# Patient Record
Sex: Male | Born: 1955 | Race: Black or African American | Hispanic: No | Marital: Married | Smoking: Never smoker
Health system: Southern US, Community
[De-identification: ages and names within clinical notes are randomized; demographics above are authoritative.]

## PROBLEM LIST (undated history)

## (undated) DIAGNOSIS — I2510 Atherosclerotic heart disease of native coronary artery without angina pectoris: Secondary | ICD-10-CM

## (undated) DIAGNOSIS — I10 Essential (primary) hypertension: Secondary | ICD-10-CM

## (undated) DIAGNOSIS — I639 Cerebral infarction, unspecified: Secondary | ICD-10-CM

## (undated) DIAGNOSIS — C801 Malignant (primary) neoplasm, unspecified: Secondary | ICD-10-CM

## (undated) DIAGNOSIS — R51 Headache: Secondary | ICD-10-CM

## (undated) DIAGNOSIS — M1990 Unspecified osteoarthritis, unspecified site: Secondary | ICD-10-CM

## (undated) DIAGNOSIS — F329 Major depressive disorder, single episode, unspecified: Secondary | ICD-10-CM

## (undated) DIAGNOSIS — I219 Acute myocardial infarction, unspecified: Secondary | ICD-10-CM

## (undated) DIAGNOSIS — Z8719 Personal history of other diseases of the digestive system: Secondary | ICD-10-CM

## (undated) DIAGNOSIS — F32A Depression, unspecified: Secondary | ICD-10-CM

## (undated) DIAGNOSIS — G629 Polyneuropathy, unspecified: Secondary | ICD-10-CM

## (undated) DIAGNOSIS — F419 Anxiety disorder, unspecified: Secondary | ICD-10-CM

## (undated) HISTORY — PX: HERNIA REPAIR: SHX51

## (undated) HISTORY — PX: APPENDECTOMY: SHX54

## (undated) HISTORY — PX: CHOLECYSTECTOMY: SHX55

## (undated) HISTORY — PX: KNEE SURGERY: SHX244

## (undated) HISTORY — PX: BACK SURGERY: SHX140

---

## 2008-01-03 ENCOUNTER — Other Ambulatory Visit: Payer: Self-pay

## 2008-01-03 ENCOUNTER — Emergency Department: Payer: Self-pay | Admitting: Emergency Medicine

## 2008-01-16 ENCOUNTER — Emergency Department: Payer: Self-pay | Admitting: Internal Medicine

## 2008-01-16 ENCOUNTER — Other Ambulatory Visit: Payer: Self-pay

## 2008-04-24 ENCOUNTER — Inpatient Hospital Stay (HOSPITAL_COMMUNITY): Admission: RE | Admit: 2008-04-24 | Discharge: 2008-04-26 | Payer: Self-pay | Admitting: Urology

## 2008-04-24 ENCOUNTER — Encounter (INDEPENDENT_AMBULATORY_CARE_PROVIDER_SITE_OTHER): Payer: Self-pay | Admitting: Urology

## 2008-05-19 HISTORY — PX: PROSTATE SURGERY: SHX751

## 2008-06-02 ENCOUNTER — Inpatient Hospital Stay (HOSPITAL_COMMUNITY): Admission: EM | Admit: 2008-06-02 | Discharge: 2008-06-07 | Payer: Self-pay | Admitting: Emergency Medicine

## 2008-06-30 ENCOUNTER — Emergency Department: Payer: Self-pay | Admitting: Emergency Medicine

## 2008-07-15 ENCOUNTER — Emergency Department (HOSPITAL_COMMUNITY): Admission: EM | Admit: 2008-07-15 | Discharge: 2008-07-15 | Payer: Self-pay | Admitting: Emergency Medicine

## 2008-08-19 ENCOUNTER — Emergency Department (HOSPITAL_COMMUNITY): Admission: EM | Admit: 2008-08-19 | Discharge: 2008-08-19 | Payer: Self-pay | Admitting: Emergency Medicine

## 2009-03-21 ENCOUNTER — Emergency Department: Payer: Self-pay | Admitting: Emergency Medicine

## 2009-05-09 ENCOUNTER — Emergency Department: Payer: Self-pay | Admitting: Internal Medicine

## 2009-05-16 ENCOUNTER — Ambulatory Visit: Payer: Self-pay | Admitting: Urology

## 2009-08-24 ENCOUNTER — Ambulatory Visit (HOSPITAL_BASED_OUTPATIENT_CLINIC_OR_DEPARTMENT_OTHER): Admission: RE | Admit: 2009-08-24 | Discharge: 2009-08-24 | Payer: Self-pay | Admitting: Orthopedic Surgery

## 2010-01-05 ENCOUNTER — Emergency Department: Payer: Self-pay | Admitting: Emergency Medicine

## 2010-02-01 ENCOUNTER — Emergency Department: Payer: Self-pay | Admitting: Emergency Medicine

## 2010-02-20 IMAGING — CT CT PELVIS W/ CM
3 of 7 series · 17 of 46 positions shown, 19 images · IV contrast (agent unspecified)
Comparison: None

CT ABDOMEN

CLINICAL DATA: Bleeding and excoriation of the penis, scrotum, and
perineum.  Recent prostate surgery done on 04/24/2008.

CT ABDOMEN AND PELVIS WITH CONTRAST
TECHNIQUE: Multidetector CT imaging of the abdomen and pelvis was
performed using the standard protocol following bolus
administration of intravenous contrast.
Contrast: 100 ml Lmnipaque-2SS

[Series 2: abd_pel 5.0 b40f st · axial · 0.74mm/px · z∈[-508,-58]mm · 12 of 108 slices shown, 14 images]
[im 9/108  soft-tissue]
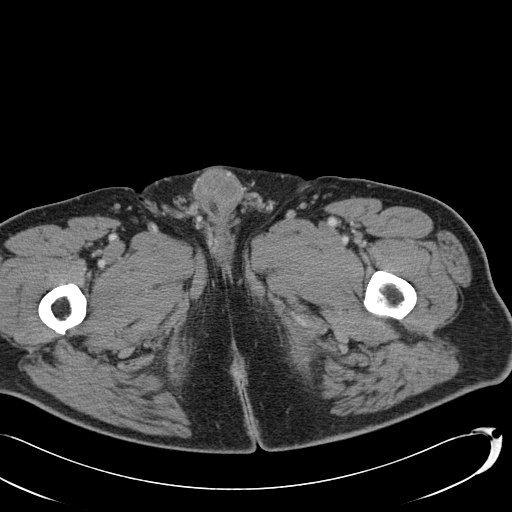
[im 9/108  bone]
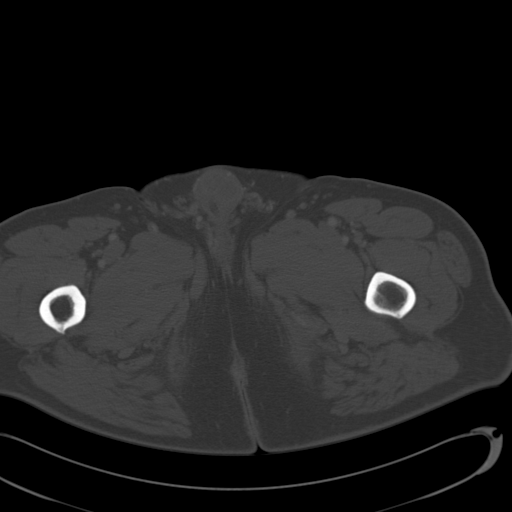
[im 17/108  soft-tissue]
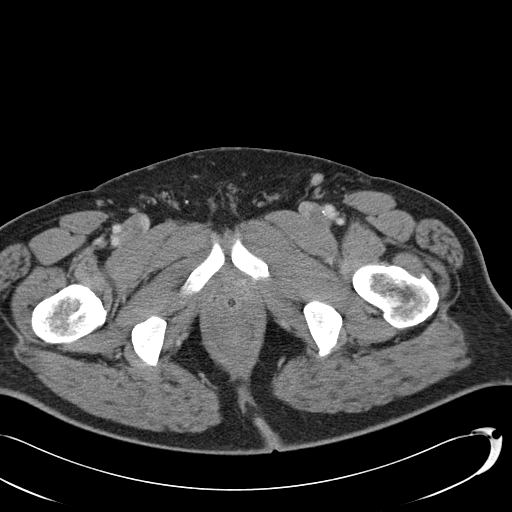
[im 25/108  soft-tissue]
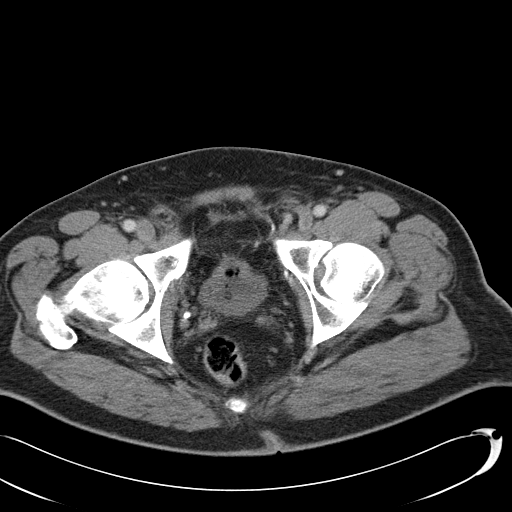
[im 33/108  soft-tissue]
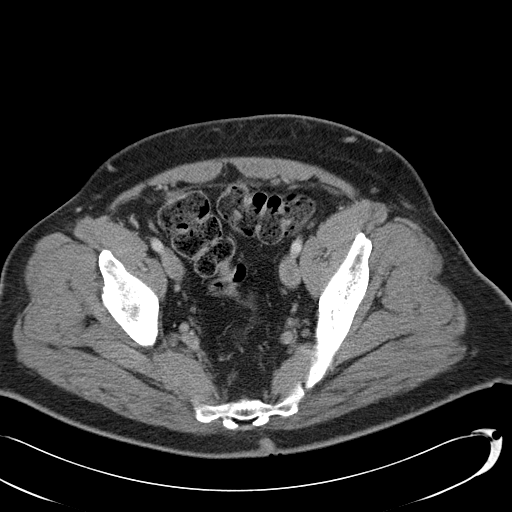
[im 42/108  soft-tissue]
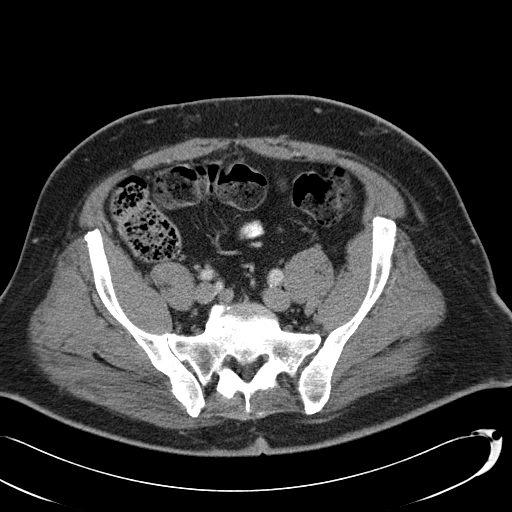
[im 50/108  soft-tissue]
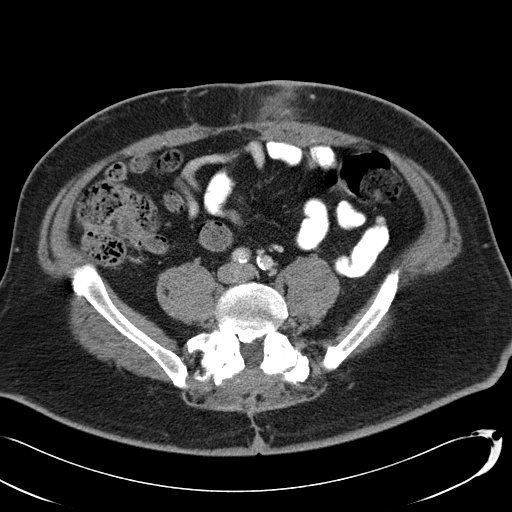
[im 58/108  soft-tissue]
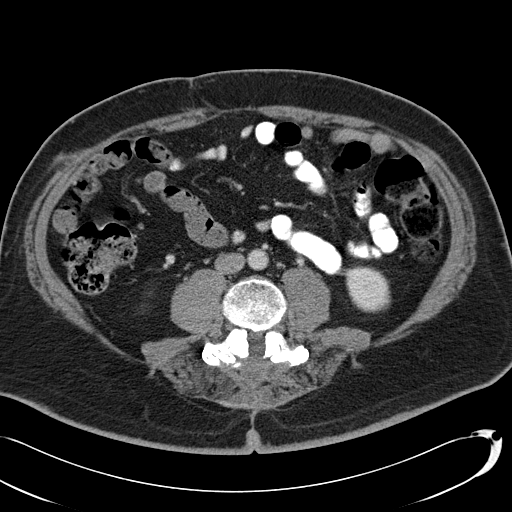
[im 66/108  soft-tissue]
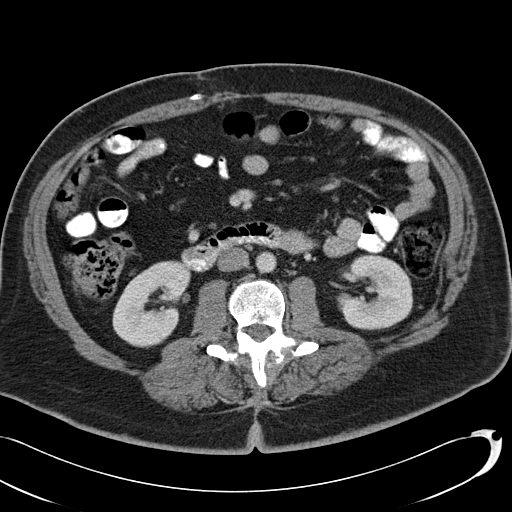
[im 75/108  soft-tissue]
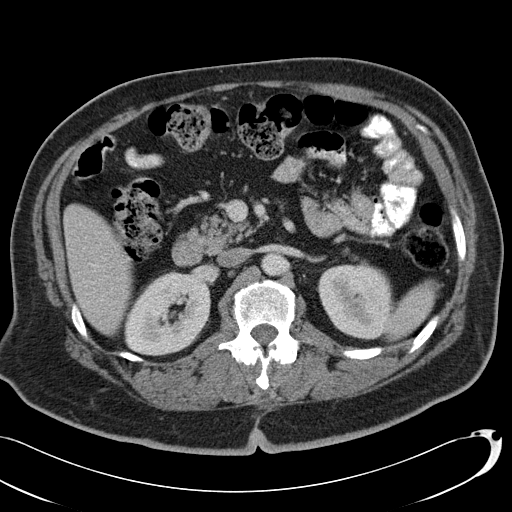
[im 75/108  bone]
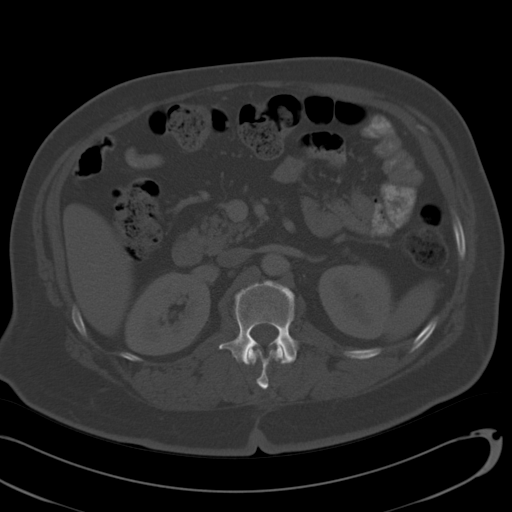
[im 83/108  soft-tissue]
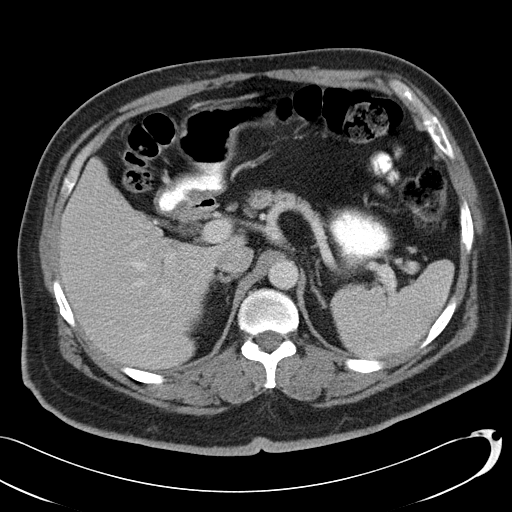
[im 91/108  soft-tissue]
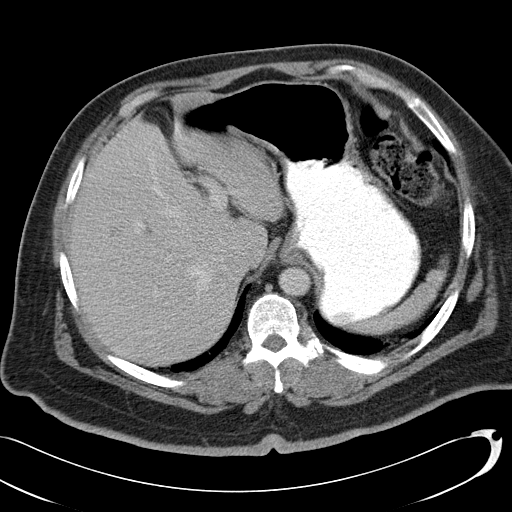
[im 99/108  soft-tissue]
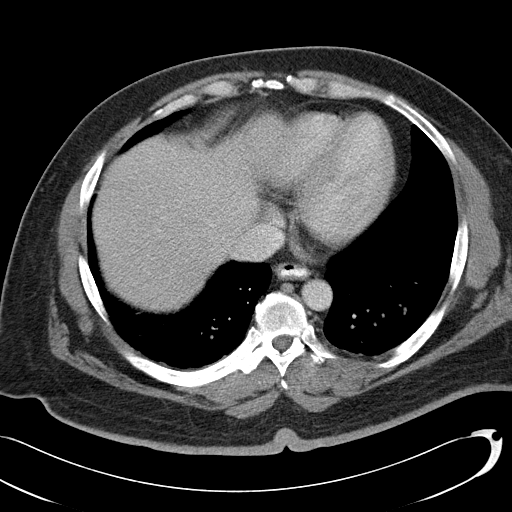

[Series 6: bladder_delay 5.0 b40f st · axial · 0.74mm/px · z∈[-540,-500]mm · 2 of 39 slices shown]
[im 8/39  soft-tissue]
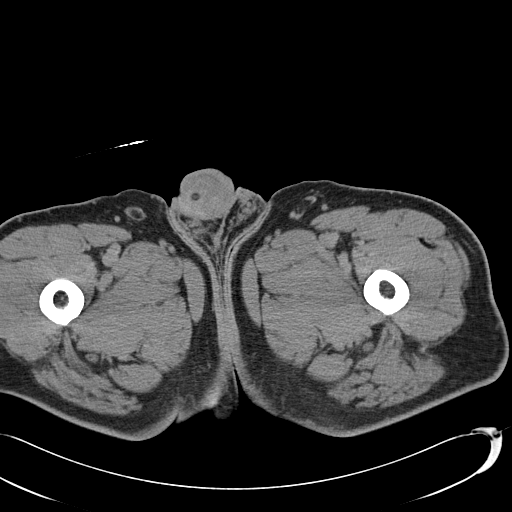
[im 16/39  soft-tissue]
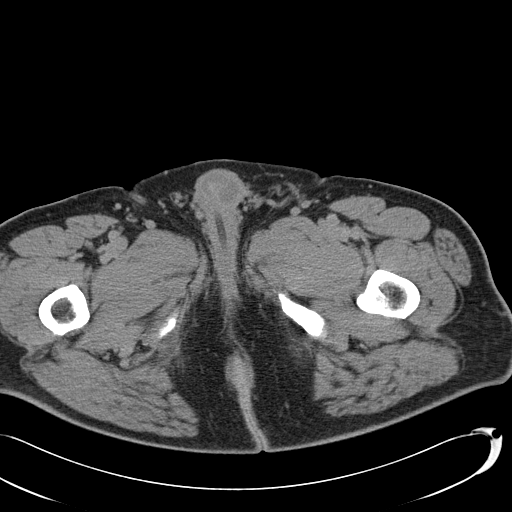

[Series 602: <mpr thick range> · coronal · 1.08mm/px · 3 of 86 slices shown]
[im 29/86  soft-tissue]
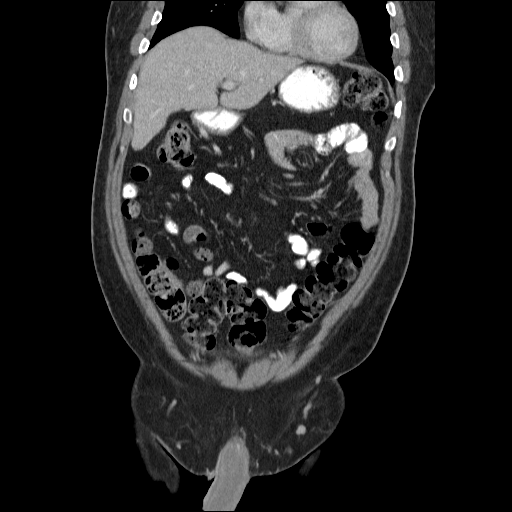
[im 38/86  soft-tissue]
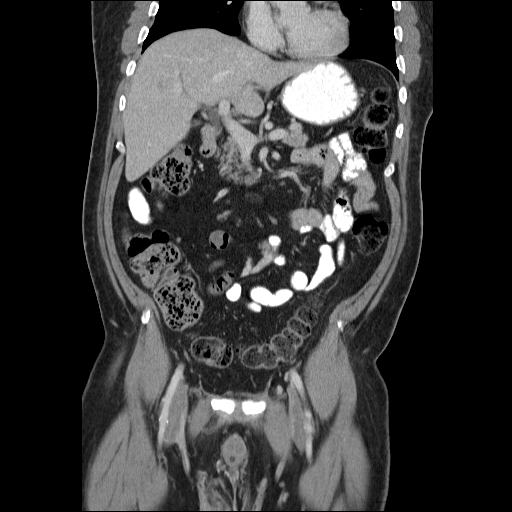
[im 48/86  soft-tissue]
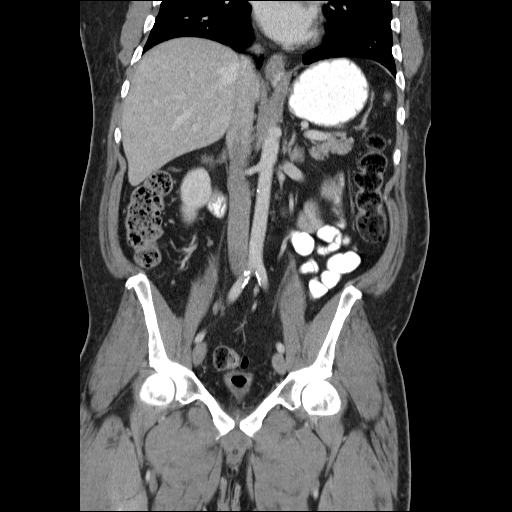

[17 of 46 positions shown; findings below may reference images not displayed]

FINDINGS: The liver, spleen, pancreas, adrenal glands, and kidneys are
normal.  Gallbladder has been removed.  There are no dilated loops
of large small bowel.  No free air or free fluid.  No adenopathy.
No acute bony abnormality.  Evidence of prior lower lumbar spine
fusion and laminectomies.
IMPRESSION: No acute abnormality of the abdomen.

CT PELVIS
FINDINGS: There is a Foley catheter in the bladder.  There is
slight edema in the cutaneous tissues of the anterior aspect of the
abdomen just to the left of the umbilicus.  Did the patient have
laparoscopic surgery?  There is no adenopathy or free fluid in the
pelvis.  There is no evidence of a perineal abscess.  The skin of
the scrotum appears slightly thickened.  There is no inguinal
adenopathy or other significant abnormality.  The soft tissues of
the perineum appear normal.
IMPRESSION: Slight thickening of the skin of the scrotum.  No other significant
abnormality.

## 2010-06-09 ENCOUNTER — Encounter: Payer: Self-pay | Admitting: Orthopedic Surgery

## 2010-08-07 LAB — BASIC METABOLIC PANEL
CO2: 28 mEq/L (ref 19–32)
Calcium: 9.5 mg/dL (ref 8.4–10.5)
Creatinine, Ser: 0.8 mg/dL (ref 0.4–1.5)
GFR calc Af Amer: >60 mL/min (ref >60)
GFR calc non Af Amer: >60 mL/min (ref >60)
Sodium: 137 mEq/L (ref 135–145)

## 2010-08-07 LAB — GLUCOSE, CAPILLARY: Glucose-Capillary: 184 mg/dL — ABNORMAL HIGH (ref 70–99)

## 2010-08-07 LAB — POCT HEMOGLOBIN-HEMACUE: Hemoglobin: 13.9 g/dL (ref 13.0–17.0)

## 2010-08-16 ENCOUNTER — Emergency Department: Payer: Self-pay | Admitting: Emergency Medicine

## 2010-08-28 LAB — GLUCOSE, CAPILLARY

## 2010-08-29 ENCOUNTER — Emergency Department: Payer: Self-pay | Admitting: Internal Medicine

## 2010-09-02 LAB — GLUCOSE, CAPILLARY
Glucose-Capillary: 175 mg/dL — ABNORMAL HIGH (ref 70–99)
Glucose-Capillary: 200 mg/dL — ABNORMAL HIGH (ref 70–99)
Glucose-Capillary: 220 mg/dL — ABNORMAL HIGH (ref 70–99)
Glucose-Capillary: 231 mg/dL — ABNORMAL HIGH (ref 70–99)
Glucose-Capillary: 121 mg/dL — ABNORMAL HIGH (ref 70–99)
Glucose-Capillary: 163 mg/dL — ABNORMAL HIGH (ref 70–99)
Glucose-Capillary: 198 mg/dL — ABNORMAL HIGH (ref 70–99)
Glucose-Capillary: 212 mg/dL — ABNORMAL HIGH (ref 70–99)
Glucose-Capillary: 217 mg/dL — ABNORMAL HIGH (ref 70–99)
Glucose-Capillary: 239 mg/dL — ABNORMAL HIGH (ref 70–99)
Glucose-Capillary: 246 mg/dL — ABNORMAL HIGH (ref 70–99)
Glucose-Capillary: 259 mg/dL — ABNORMAL HIGH (ref 70–99)
Glucose-Capillary: 325 mg/dL — ABNORMAL HIGH (ref 70–99)

## 2010-09-02 LAB — COMPREHENSIVE METABOLIC PANEL
BUN: 18 mg/dL (ref 6–23)
CO2: 25 mEq/L (ref 19–32)
Calcium: 9.6 mg/dL (ref 8.4–10.5)
Chloride: 105 mEq/L (ref 96–112)
Creatinine, Ser: 0.94 mg/dL (ref 0.4–1.5)
GFR calc non Af Amer: >60 mL/min (ref >60)
Total Bilirubin: 0.9 mg/dL (ref 0.3–1.2)

## 2010-09-02 LAB — URINE CULTURE
Colony Count: NO GROWTH
Culture: NO GROWTH

## 2010-09-02 LAB — BASIC METABOLIC PANEL
BUN: 8 mg/dL (ref 6–23)
Calcium: 8.8 mg/dL (ref 8.4–10.5)
GFR calc non Af Amer: >60 mL/min (ref >60)
GFR calc non Af Amer: >60 mL/min (ref >60)
Glucose, Bld: 265 mg/dL — ABNORMAL HIGH (ref 70–99)
Glucose, Bld: 346 mg/dL — ABNORMAL HIGH (ref 70–99)
Potassium: 4.6 mEq/L (ref 3.5–5.1)
Sodium: 134 mEq/L — ABNORMAL LOW (ref 135–145)

## 2010-09-02 LAB — CBC
HCT: 39.7 % (ref 39.0–52.0)
HCT: 34.4 % — ABNORMAL LOW (ref 39.0–52.0)
HCT: 35.8 % — ABNORMAL LOW (ref 39.0–52.0)
HCT: 36.5 % — ABNORMAL LOW (ref 39.0–52.0)
Hemoglobin: 11.3 g/dL — ABNORMAL LOW (ref 13.0–17.0)
Hemoglobin: 11.7 g/dL — ABNORMAL LOW (ref 13.0–17.0)
MCHC: 33.4 g/dL (ref 30.0–36.0)
MCHC: 32.8 g/dL (ref 30.0–36.0)
MCV: 87.4 fL (ref 78.0–100.0)
Platelets: 159 10*3/uL (ref 150–400)
Platelets: 171 10*3/uL (ref 150–400)
Platelets: 194 10*3/uL (ref 150–400)
RBC: 4.54 MIL/uL (ref 4.22–5.81)
RBC: 4.32 MIL/uL (ref 4.22–5.81)
RDW: 13.1 % (ref 11.5–15.5)
RDW: 13.2 % (ref 11.5–15.5)
WBC: 10.2 10*3/uL (ref 4.0–10.5)
WBC: 8.1 10*3/uL (ref 4.0–10.5)
WBC: 8.4 10*3/uL (ref 4.0–10.5)
WBC: 8.7 10*3/uL (ref 4.0–10.5)

## 2010-09-02 LAB — URINALYSIS, ROUTINE W REFLEX MICROSCOPIC
Bilirubin Urine: NEGATIVE
Nitrite: NEGATIVE
Protein, ur: 100 mg/dL — AB
Specific Gravity, Urine: 1.041 — ABNORMAL HIGH (ref 1.005–1.030)
Urobilinogen, UA: 1 mg/dL (ref 0.0–1.0)

## 2010-09-02 LAB — CULTURE, BLOOD (ROUTINE X 2)
Culture: NO GROWTH
Culture: NO GROWTH

## 2010-09-02 LAB — DIFFERENTIAL
Basophils Absolute: 0.1 10*3/uL (ref 0.0–0.1)
Lymphocytes Relative: 28 % (ref 12–46)
Neutro Abs: 6.3 10*3/uL (ref 1.7–7.7)
Neutrophils Relative %: 63 % (ref 43–77)

## 2010-09-02 LAB — LIPID PANEL
LDL Cholesterol: 101 mg/dL — ABNORMAL HIGH (ref 0–99)
Triglycerides: 229 mg/dL — ABNORMAL HIGH (ref <150)

## 2010-09-02 LAB — URINE MICROSCOPIC-ADD ON

## 2010-09-02 LAB — HEMOGLOBIN A1C: Mean Plasma Glucose: 272 mg/dL

## 2010-09-02 LAB — TSH: TSH: 0.444 u[IU]/mL (ref 0.350–4.500)

## 2010-09-03 LAB — CBC
HCT: 39.9 % (ref 39.0–52.0)
Hemoglobin: 12.9 g/dL — ABNORMAL LOW (ref 13.0–17.0)
Platelets: 183 10*3/uL (ref 150–400)
RBC: 4.49 MIL/uL (ref 4.22–5.81)
WBC: 12.3 10*3/uL — ABNORMAL HIGH (ref 4.0–10.5)

## 2010-09-03 LAB — DIFFERENTIAL
Eosinophils Relative: 1 % (ref 0–5)
Lymphocytes Relative: 14 % (ref 12–46)
Lymphs Abs: 1.7 10*3/uL (ref 0.7–4.0)
Monocytes Relative: 6 % (ref 3–12)

## 2010-09-03 LAB — BASIC METABOLIC PANEL
BUN: 13 mg/dL (ref 6–23)
GFR calc Af Amer: >60 mL/min (ref >60)
GFR calc non Af Amer: >60 mL/min (ref >60)
Potassium: 4.6 mEq/L (ref 3.5–5.1)
Sodium: 137 mEq/L (ref 135–145)

## 2010-09-03 LAB — GLUCOSE, CAPILLARY: Glucose-Capillary: 353 mg/dL — ABNORMAL HIGH (ref 70–99)

## 2010-10-01 NOTE — H&P (Signed)
NAMERALPHAEL, SOUTHGATE NO.:  [id number]   MEDICAL RECORD NO.:  [id number]          PATIENT TYPE:  INP   LOCATION:  1533                         FACILITY:  [facility]   PHYSICIAN:  Darryl D. Prime, MD    DATE OF BIRTH:  1955-10-12   DATE OF ADMISSION:  06/01/2008  DATE OF DISCHARGE:                              HISTORY & PHYSICAL   CHIEF COMPLAINT:  Scrotum swollen and very painful.   HISTORY OF PRESENT ILLNESS:  Mr. Cacciola is a 55 year old male with a  history of localized prostate cancer, status post robotic-assisted  laparoscopic radical prostatectomy in December 2009 by Dr. Laverle Patter.  The  patient had his Foley  discontinued 2 weeks after the surgery.  He notes  he still has significant problems with dribbling.  He notes 2 weeks  after the surgery also  scrotal redness and rawness where the skin is  very tender.  The patient notes he was seen in clinic by urology for  this and was apparently placed on a topical cream for it.  He is unsure  what the medicine was, but he notes that there was no improvement.  He  notes his pain persisted and over the last 3 days has developed a fever.  He also notes over the last 2 days dysuria and hematuria associated with  cloudy urine.  Over the last 24 hours, he has been nauseous, unable to  take any p.o.  He presented to the emergency room.  His blood sugars  have been in the 500 range over the last 2 days.   PAST MEDICAL AND SURGICAL HISTORY:  1. A history of prostate cancer as above.  2. History of erectile dysfunction.  3. History of possible coronary disease and possible small heart      attack in 2000.  4. He has a history of diabetes on insulin since 1994.  5. History of hyperlipidemia.  6. History of gastric ulcer.  7. History of transient ischemic attack.  8. Status post appendectomy in 1987.  9. Status post two back surgeries.  10.Status post cholecystectomy in 2000.   ALLERGIES:  PENICILLIN which causes throat  swelling.   MEDICATIONS:  1. OxyContin as needed for pain.  He is unsure of dose that he has      been taking a lot more here recently because of pain.  2. He is on Humalog obtained by sliding-scale.  3. He notes that he also takes an additional morphine pill for pain.   He notes he is on other medications that he cannot recall and he sees a  Dr. Fraser Din, endocrinology.  Has no primary care physician.   SOCIAL HISTORY:  No history ever of tobacco, alcohol or illicit drug  use.  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   FAMILY HISTORY:  The patient's family history is positive for end-stage  renal disease, stroke, and coronary artery disease.  Sister had lung  cancer.   REVIEW OF SYSTEMS:  A 14-point review of systems is negative unless stated above.   PHYSICAL EXAMINATION:  VITAL  SIGNS:  Temperature is 97.6 with a blood  pressure 119/84, pulse of 18, respiratory rate 22, oxygen saturation 97%  on room air.  GENERAL:  The patient does not appear acutely ill and is not in apparent  distress.  HEENT:  Normocephalic, atraumatic.  Pupils equal, round and reactive to  light with extraocular movements being intact.  The oropharynx shows no  posterior pharyngeal lesions.  NECK:  Supple with no lymphadenopathy or thyromegaly.  No carotid  bruits.  No jugular venous distention.  LUNGS:  Clear to auscultation  bilaterally.  CARDIOVASCULAR:  Regular rate and rhythm with no murmurs, rubs or  gallops.  Normal S1 and S2.  No S3 or S4.  ABDOMEN:  Obese, soft, nontender, nondistended with no splenomegaly.  He  has a significant scar right of his median.  Please note he has mild  right lower quadrant tenderness which he notes he has had since his  surgery on the prostate.  The patient shows no CVA tenderness.  GU:  His scrotal examine is extremely tender and the skin of the scrotum  is also erythematous there and shows some mild thickening and the  surrounding skin is also  dry.  He also has pain in the area of the  epididymis  diffusely on the right.  There is some mild fullness there  as well.  There are no signs of induration.  His  perineum appears to be  okay.   LABORATORY DATA:  A white count of 10.2, hemoglobin of 13.3, hematocrit  39.7 with platelets of 194,000, segs of 63.  Sodium 136 with a potassium  of 4.4, chloride 105, bicarb 25, BUN 18 with a creatinine 0.94, glucose  of 208.  Otherwise normal LFTs.  Urinalysis showed rbc's 11-20, wbc's  too-numerous-to-count, bacteria many.  Urine was cloudy.  Esterase was  moderate as well as blood, nitrite negative.  CT of the abdomen and  pelvis shows slight thickening of the skin around the scrotum; otherwise  no signs of abscess or problems with the perineum.   ASSESSMENT/PLAN:  This is a patient with history of diabetes,  longstanding.  Now has possible scrotal cellulitis, epididymitis.  He  has a urinary tract infection.  Cannot exclude pyelonephritis.  At this  time given his diabetes history and the recent  surgery on his prostate,  he will be admitted for IV antibiotics trying to cover hospital-acquired  bugs.  He will be placed on vancomycin and ciprofloxacin.  He has a  penicillin allergy.  We will get blood cultures and urine cultures.  We  will control his pain and we will control his blood sugar.  Will get a  urology consult  in the morning as well concerning his presentation.      Darryl D. Prime, MD  Electronically Signed     DDP/MEDQ  D:  06/02/2008  T:  06/02/2008  Job:  [id number]

## 2010-10-01 NOTE — Consult Note (Signed)
Gabriel [name], [facility] NO.:  [id number]   MEDICAL RECORD NO.:  [id number]          PATIENT TYPE:  INP   LOCATION:  1533                         FACILITY:  [facility]   PHYSICIAN:  Mark C. Vernie Ammons, M.D.  DATE OF BIRTH:  03-08-1956   DATE OF CONSULTATION:  06/02/2008  DATE OF DISCHARGE:                                 CONSULTATION   The patient is seen in hospital consultation for further evaluation of  scrotal pain and cellulitis.  He has a history of adenocarcinoma of the  prostate for which he underwent a robotic laparoscopic radical  prostatectomy on April 24, 2008.  He was seen back in the office and  had his Foley catheter removed, but was having some incontinence and was  managing that with a condom catheter.  When he was seen by Dr. Laverle Patter in  the office last week he was noted to have a small area of skin breakdown  at the base of the penis due to the condom catheter, and at that time it  was recommended that be discontinued and a drying agent be applied to  the scrotum which was showing signs of some chemical irritation  secondary to the urine.  His scrotal and genital pain progressively  worsened until he came to the emergency room with severe scrotal pain.  He was found to have an erythematous scrotum, and there a CT scan of the  abdomen and pelvis was obtained.  The CT revealed no abnormality of the  abdomen or pelvis.  The scrotum revealed no sign of gas or abscess.  He  was therefore admitted to the hospitalist service in order to control  his diabetes and pain.  He does have chronic back pain, and requires  significant amount of chronic narcotic pain medication to control this.   PAST MEDICAL HISTORY:  1. History of acute MI.  2. Degenerative joint disease.  3. Type 2 diabetes.  4. Dyslipidemia.  5. History of gastric ulcer.  6. Prior history of TIAs  7. Adenocarcinoma of the prostate.   SURGICAL HISTORIES:  1. Appendectomy.  2. History of back  surgery.  3. Cholecystectomy.  4. Radical prostatectomy.   MEDICATIONS:  Avandia, Glucovance, hydrocodone, Lantus insulin,  meloxicam, Percocet, temazepam, Ultracet.   ALLERGIES:  PENICILLIN.   SOCIAL HISTORY:  He denies alcohol or tobacco use.   FAMILY HISTORY:  Positive for end-stage renal disease, stroke, coronary  artery disease.  He has a paternal uncle with a history of prostate  cancer.   REVIEW OF SYSTEMS:  Is as noted above; otherwise, complete review of  systems is negative.   PHYSICAL EXAMINATION:  GENERAL:  The patient is well-developed, well-  nourished black male in mild distress.  Blood pressure is 147/87, pulse 81, temperature 97.7.  HEENT:  Atraumatic, normocephalic.  Oropharynx clear.  NECK:  Supple with midline trachea.  CHEST:  Reveals normal respiratory effort.  CARDIOVASCULAR:  Regular rate and rhythm.  ABDOMEN:  Soft and nontender with subcostal, paramedian scars as well as  healing sites from his recent robotic prostatectomy.  GU:  Reveals a  normal circumcised phallus with a Foley catheter  indwelling.  The penis reveals no edema or lesion at this time.  His  scrotum is markedly tender, although there is no fluctuance or crepitus.  It is erythematous with some mild cracking of the skin noted.  EXTREMITIES:  Without clubbing, cyanosis or edema.  NEUROLOGICAL:  There are no gross focal neurologic deficits.   LABORATORY RESULTS:  Sodium 136, potassium 4.4, BUN 18, creatinine 94,  glucose 208.  White blood cell count 10.2, hematocrit 39.7.  Urinalysis  had 11-20 red blood cells, too numerous to count white blood cells, many  bacteria, and few yeast.   IMPRESSION:  1. Adenocarcinoma of the prostate status post radical prostatectomy.  2. 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  3. Scrotal cellulitis without elevated white count on top of chemical      irritation from his urine to the scrotum.  4. Apparent urinary tract  infection.   PLAN:  1. His urine has been cultured.  2. Agree with leaving his Foley catheter indwelling in order to keep      his scrotum dry at this time.  3. He is then placed on Cipro and vancomycin until culture results are      known of which I agree for pain control and tight blood sugar      control.  4. Will notify Dr. Roger Shelter of the patient's admission.      Mark C. Vernie Ammons, M.D.  Electronically Signed     MCO/MEDQ  D:  06/02/2008  T:  06/02/2008  Job:  [id number]

## 2010-10-01 NOTE — Discharge Summary (Signed)
[name], [name] NO.:  [id number]   MEDICAL RECORD NO.:  [id number]          PATIENT TYPE:  INP   LOCATION:                               FACILITY:  [facility]   PHYSICIAN:  Marcellus Scott, MD     DATE OF BIRTH:  Sep 28, 1955   DATE OF ADMISSION:  06/02/2008  DATE OF DISCHARGE:  06/07/2008                               DISCHARGE SUMMARY   PRIMARY MEDICAL DOCTOR:  Gentry Fitz,.   ENDOCRINOLOGIST:  Margaretmary Bayley, M.D.   UROLOGIST:  Heloise Purpura, M.D.   DISCHARGE DIAGNOSES:  1. Scrotal cellulitis, improved.  2. Poorly controlled insulin-requiring diabetes mellitus.  3. Anemia.  4. Chronic headaches.  5. Chronic narcotic use/dependence.  6. Coronary artery disease, asymptomatic.  7. Dyslipidemia.  8. Prostate cancer, status post radical prostatectomy.   DISCHARGE MEDICATIONS:  1. Lantus 30 units subcutaneously q.h.s.  2. NovoLog 3 units subcutaneously t.i.d. with meals.  3. NovoLog sliding scale insulin per directions.  4. Cipro 500 mg p.o. b.i.d. through June 15, 2008.  5. Clindamycin 300 mg p.o. q.i.d. through June 15, 2008.  6. Senokot 2 tablets p.o. b.i.d.  7. MiraLax 17 g in 8 ounces of liquid p.o. daily .  8. Oxycodone reduced to 5-10 mg p.o. q. 4 hourly p.r.n.  9. Dulcolax 10 mg per rectal p.r.n.  10.Tramadol 50 mg p.o. q.6 hourly p.r.n.  11.Restoril 15 mg p.o. q.h.s. p.r.n.  12.Avinza 60 mg p.o. daily.  13.Lyrica 75 mg p.o. daily.  14.Meloxicam 15 mg p.o. daily.  15.Cymbalta 60 mg p.o. daily.  16.Kapidex 60 mg p.o. q.h.s.  17.Milk of magnesia 30 mL p.o. p.r.n.   DISCONTINUED MEDICATION:  Humalog sliding scale insulin.   CONSULTATIONS:  Urology, Dr. Laverle Patter.   HOSPITAL COURSE AND PATIENT DISPOSITION:  Gabriel [name] is a 55 year old  African American male patient with history of localized prostate cancer  status post robotic-assisted laparoscopic radical prostatectomy December  2009, poorly controlled diabetes mellitus, dyslipidemia,  coronary artery  disease who indicated that 2 weeks after the surgery his Foley was  discontinued and had problems with dribbling of urine and started  noticing scrotal redness and rawness of the skin.  He was seen by  urology.  There was no significant improvement.  Three days prior to the  admission he developed a fever.  He also complained of dysuria,  hematuria.  He thereby presented to the emergency room where his blood  sugars were in the 500 range.  He was admitted for further evaluation  and management.   1. Scrotal cellulitis:  The patient was admitted to the hospital.  He      was placed on IV vancomycin and ciprofloxacin, being this patient      is PENICILLIN allergic.  Urology was consulted.  They carefully      evaluated him and followed him in the hospital.  His CT scan was      not suggestive of a scrotal abscess or Fournier gangrene.  Scrotal      ultrasound was done which showed mild hydrocele but no abscess.      With  these measures, the patient has slowly but steadily made      improvement.  The swelling has resolved.  The erythema is much      better, which is only mild now.  The patient is able to ambulate.      The patient was switched to oral ciprofloxacin and clindamycin on      January 18.  He has continued to remain afebrile.  2. Poorly controlled diabetes:  The patient indicated that his sugars      at home were quite high.  He sees an endocrinologist.  In any      event, the patient was placed on Lantus and his insulin doses have      been adjusted with current sugars ranging between 143-217 mg/dL      over the last 48 hours.  This is fairly good control compared to      early on in the admission.  The patient will be discharged on the      above medications.  He has been educated regarding diabetes.  His      insulin requirement may drop with control in his infection or may      even increase on a home diet.  This has to be closely monitored and       adjustments to be made as deemed necessary.  The patient was not on      any oral medications when he came in.  He apparently used to be on      metformin and Januvia in the past.  Recommend patient follow up      with his endocrinologist approximately a week from discharge for      further evaluation and management.  3. Anemia, which is stable.  4. Chronic headache:  The patient indicates that he had a head injury      in the early 1980s and since then he had been having chronic almost      daily headache and hence is taking chronic high-dose narcotic pain      medications.  This is probably also contributing to his      constipation.  Recommend  having patient be seen at a pain clinic      as an outpatient.   The patient at this time has had a BM today which was slightly painful  because of hard stools but otherwise is doing better with stable vital  signs and physical examination.  The patient indicates that he is  looking for a primary medical doctor.  The patient at this time will be  discharged home with his family.  The Foley catheter will be left in  place until he sees the urologist on June 21, 2008.  At the same time  the catheter will be removed.  The patient will complete a total 2 weeks  of antibiotics.   Time spent: 40 minutes.      Marcellus Scott, MD  Electronically Signed     AH/MEDQ  D:  06/07/2008  T:  06/07/2008  Job:  [id number]   cc:   Gabriel [name], M.D.  Fax: [phone]   Heloise Purpura, MD  Fax: [phone]   Margaretmary Bayley, M.D.  Fax: [phone]

## 2010-10-01 NOTE — Discharge Summary (Signed)
Gabriel [name] NO.:  [id number]   MEDICAL RECORD NO.:  [id number]          PATIENT TYPE:  INP   LOCATION:  1419                         FACILITY:  [facility]   PHYSICIAN:  Heloise Purpura, MD      DATE OF BIRTH:  1956-04-18   DATE OF ADMISSION:  04/24/2008  DATE OF DISCHARGE:  04/26/2008                               DISCHARGE SUMMARY   ADMISSION DIAGNOSIS:  Prostate cancer.   DISCHARGE DIAGNOSIS:  Prostate cancer.   PROCEDURES:  Robotic-assisted laparoscopic radical prostatectomy.   HISTORY AND PHYSICAL:  For full details, please see admission history  and physical.  Briefly, Mr. Gabriel [name] is a 55 year old gentleman with a  history of clinically localized adenocarcinoma of the prostate.  After  discussion regarding management options for treatment, he elected to  proceed with surgical therapy and a robotic-assisted laparoscopic  radical prostatectomy.   HOSPITAL COURSE:  On April 24, 2008, he was taken to the operating  room and underwent the above procedure.  He tolerated this procedure  well without complications.  Postoperatively, he was able to be  transferred to a regular hospital room and recovered uneventfully that  evening.  He was able to begin ambulating, which he did without  difficulty and on postoperative day #1 remained hemodynamically stable  with a hematocrit of 37.8.  He did experience nausea and vomiting on the  morning of postoperative day #1, which necessitated holding his oral  intake of liquids.  He was a known diabetic and his blood glucose levels  were managed with a sliding scale of insulin.  He began tolerating  liquids later in the day on postoperative day #1 and was able to begin  ambulating somewhat better.  He did have significant pain, which was  partly related to his history of chronic pain and chronic narcotic use.  He was transitioned to an oral pain regimen on postoperative day #1.  He  was re-evaluated on the afternoon of  postoperative day #1 and had not  had a significant amount of oral intake at that point.  He was not  ambulating very well and his pain was not very well controlled.  It was  therefore decided that he stay that evening and was re-evaluated on the  morning of postoperative day #2.  He did begin having return of bowel  function and was passing flatus and had a small bowel movement over that  evening.  He remained hemodynamically stable and was able to continue  tolerating a clear liquid diet on the morning postoperative day #2.  He  was able to ambulate without difficulty and his pain was better  controlled.  He was therefore discharged home.   DISPOSITION:  Home.   DISCHARGE MEDICATIONS:  He was instructed to resume his regular home  medications and was instructed to begin a pain regimen of Avinza which  he had been on and to use Percocet as needed for p.r.n. pain.  He was  also given a prescription to begin Cipro 1 day prior to his return visit  for Foley catheter removal.   DISCHARGE  INSTRUCTIONS:  He was instructed to be ambulatory, but  specifically told to refrain from any heavy lifting, strenuous activity,  or driving.  He was also instructed on routine Foley catheter care.   FOLLOWUP:  He will plan to follow up in 1 week for removal of his Foley  catheter and to discuss his surgical pathology in detail.      Heloise Purpura, MD  Electronically Signed     LB/MEDQ  D:  04/26/2008  T:  04/27/2008  Job:  [id number]

## 2010-10-01 NOTE — H&P (Signed)
Gabriel [name], BOMAR NO.:  [id number]   MEDICAL RECORD NO.:  [id number]          PATIENT TYPE:  INP   LOCATION:  NA                           FACILITY:  [facility]   PHYSICIAN:  Heloise Purpura, MD      DATE OF BIRTH:  [birth year]   DATE OF ADMISSION:  04/24/2008  DATE OF DISCHARGE:                              HISTORY & PHYSICAL   CHIEF COMPLAINT:  Prostate cancer.   HISTORY:  Mr. Gabriel [name] is a 55 year old gentleman who was found to have an  elevated PSA of 6.48 and therefore underwent a prostate needle biopsy on  January 15, 2008 by Dr. Irineo Axon in [place], [place].  This demonstrated 5/10 cores to be positive for adenocarcinoma of the  prostate.  All cores were graded as a Gleason 3 + 3 = 6.  His prostate  volume was measured at 23.4 mL.  He does have significant voiding  symptoms with a baseline IPSS of 31.  He also has moderate to severe  erectile dysfunction with a baseline SHIM of 10.  He does have a  significant history of coronary artery disease and does have a history  of myocardial infarction in the past.  He has undergone a cardiac  evaluation and stress test and is felt to be at low cardiovascular risk  for undergoing surgical treatment of his prostate cancer.  In addition,  he was noted to have hematochezia on his initial evaluation was sent for  a gastroenterology evaluation.  He did undergo colonoscopy which was  negative with no clear etiology for his hematochezia.   He has undergone multiple abdominal surgeries in the past including an  open cholecystectomy in 2000 and an exploratory laparotomy and  appendectomy in 1987 with a large paramedian incision.   Based on his voiding symptoms and young age, he has elected to proceed  with surgical therapy for his prostate cancer.  He understands the  significant risk for open surgical conversion.   PAST MEDICAL HISTORY:  1. History of myocardial infarction.  2. History of arthritis.  3.  History of diabetes.  4. History of dyslipidemia.  5. History of gastric ulcer.  6. History of TIA   PAST SURGICAL HISTORY:  1. Appendectomy.  2. Back surgery.  3. Cholecystectomy.   ALLERGIES:  PENICILLIN.   FAMILY HISTORY:  Family history is positive for end-stage renal disease,  history of stroke, history of coronary artery disease, and his sister  has a history of lung cancer.  He did have a paternal uncle with  prostate cancer.   SOCIAL HISTORY:  He is currently married.  He denies alcohol or tobacco  use.   REVIEW OF SYSTEMS:  A complete review of systems was performed with all  systems negative except as noted above.   PHYSICAL EXAMINATION:  CONSTITUTIONAL:  Well-nourished, well-developed,  age-appropriate male in no acute distress.  CARDIOVASCULAR:  Regular rate and rhythm without obvious murmurs.  LUNGS:  Clear bilaterally.  ABDOMEN:  Soft and nontender without masses.  The patient has a  subcostal and paramedian incision site.  GU:  The prostate size is estimated to be 35 mL.  He does have  induration of firmness along the right side of the prostate.   IMPRESSION:  Clinically localized adenocarcinoma of the prostate.   PLAN:  He will undergo a robotic-assisted laparoscopic radical  prostatectomy with wide resection of the neurovascular bundle on the  right side.  He understands that he is at high risk for open surgical  conversion based on his prior surgical history.      Heloise Purpura, MD  Electronically Signed     LB/MEDQ  D:  04/24/2008  T:  04/24/2008  Job:  [id number]

## 2010-10-01 NOTE — Op Note (Signed)
NAME:  Gabriel [name], Gabriel [name] NO.:  [id number]   MEDICAL RECORD NO.:  [id number]          PATIENT TYPE:  INP   LOCATION:  0005                         FACILITY:  [facility]   PHYSICIAN:  Heloise Purpura, MD      DATE OF BIRTH:  January 09, 1956   DATE OF PROCEDURE:  04/24/2008  DATE OF DISCHARGE:                               OPERATIVE REPORT   PREOPERATIVE DIAGNOSIS:  Clinically localized adenocarcinoma of prostate  (clinical stage T2bN0Mx).   POSTOPERATIVE DIAGNOSIS:  Clinically localized adenocarcinoma of  prostate (clinical stage T2bN0Mx).   PROCEDURE:  Robotic-assisted laparoscopic radical prostatectomy.   SURGEON:  Heloise Purpura, M.D.   ASSISTANT:  Steward Ros, M.D.   ANESTHESIA:  General.   ESTIMATED BLOOD LOSS:  150 mL.   SPECIMENS:  1. Prostate seminal vesicles.  2. Right apical margin of the prostate.   DISPOSITION:  Specimens to pathology.   DRAINS:  1. 20-French straight catheter.  2. #19 Blake pelvic drain.   INDICATIONS:  Gabriel [name] is a 55 year old gentleman with clinically  localized adenocarcinoma of the prostate.  After discussion regarding  management options for treatment, he elected to proceed with surgical  therapy and the above procedure.  Potential risks, complications, and  alternative treatment options were discussed in detail and informed  consent was obtained.   DESCRIPTION OF PROCEDURE:  The patient was taken to the operating room  and a general anesthetic was administered.  He was given preoperative  antibiotics, placed in the dorsal lithotomy position, prepped and draped  in the usual sterile fashion.  Next, a preoperative time-out was  performed.  A Foley catheter was inserted into the bladder and a site  was selected to the left of the umbilicus for placement of the camera  port.  The patient was noted to have a prior subcostal incision and  right paramedian incision and there was concern regarding intra-  abdominal adhesions.  The  camera port site was placed using a standard  open Hassan technique which allowed entry into the peritoneal cavity  under direct vision without difficulty.  Using digital inspection of the  abdominal wall, no abdominal wall adhesions were noted in the immediate  area near the entry site.  However, there were noted be some adhesions  medially.  A 12 mm port was then placed and a pneumoperitoneum  established.  Using 0-degree lens, the abdomen was inspected and there  were noted to be adhesions between the colon, small intestine and the  abdominal wall in the lower left quadrant.  In addition, there were  adhesions noted superiorly above the level of the umbilicus which  appeared to be out of the planned operative field.  Two 8 mm robotic  ports were placed in the left abdominal wall and utilizing the  laparoscopic scissors, these adhesions were sharply taken down.  Attention then turned to the right abdominal wall and a 12 mm was placed  in the far right lateral abdominal wall, a 8 mm port placed in the right  lower quadrant, and a 5 mm port placed in the right upper quadrant  away  from the previously mentioned adhesions.  All ports were placed under  direct vision without difficulty.  The surgical cart was then docked.  With the aid of the cautery scissors, the bladder was reflected  posteriorly allowing entry into space of Retzius and identification of  the endopelvic fascia and prostate.  The endopelvic fascia was then  incised from the apex back to the base of the prostate bilaterally and  the underlying levator muscle fibers were swept laterally off the  prostate, thereby isolating the dorsal venous complex.  The dorsal vein  was then stapled and divided with a 45 mm ETS Flex stapler.  There was  noted to be some additional bleeding from the dorsal vein which was  controlled with bipolar energy.  The bladder neck was identified with  the aid of Foley catheter manipulation.  The  prostate was somewhat  difficult to visualize due to its small size.  The bladder neck was  incised anteriorly exposing the Foley catheter and the catheter balloon  was deflated.  The catheter was brought into the operative field and  used to retract the prostate anteriorly.  Posterior bladder neck was  then divided.  Dissection proceeded between the prostate and bladder  neck until the vasa deferentia and seminal vesicles were identified.  The vasa deferentia were isolated, divided and lifted anteriorly.  The  seminal vesicles were dissected down to their tips with care to control  seminal vesicle arterial blood supply and they were then lifted  anteriorly.  Space between the Denonvilliers fascia and the anterior  rectum was bluntly developed, thereby isolating the vascular pedicles of  the prostate.  Based on the patient's preoperative erectile dysfunction  and extensive palpable disease along the right side of the prostate, a  wide non-nerve-sparing prostatectomy was performed with Hem-o-lok clips  used to ligate the vascular pedicles of the prostate and sharp cold  scissor dissection used to transect these pedicles.  Once the pedicles  were divided, the urethra was identified and was sharply transected  allowing the specimen to be disarticulated.  The pelvis was then  copiously irrigated and hemostasis was insured.  There was no evidence  for a rectal injury.   Attention then turned to the urethral anastomosis.   A 2-0 Vicryl slip-knot was placed between Denonvilliers fascia, the  posterior bladder neck, and the posterior urethra to reapproximate these  structures.  A double-armed 3-0 Monocryl suture was then used to perform  a 360-degree running tension-free anastomosis between the bladder neck  and urethra.  The new 20-French coude catheter was inserted into the  bladder and irrigated.  There were no blood clots within the bladder and  the anastomosis appeared to be watertight.  A  #19 Blake drain was then  brought through the left robotic port and positioned appropriately in  the pelvis.  It was secured to skin with a nylon suture.  The surgical  cart was then undocked.  The right lateral 12 mm port site was closed  with a 0 Vicryl suture placed with the aid of suture passer device.  All  remaining ports were then removed under direct vision and the prostate  specimen was removed within the Endopouch retrieval bag via the  periumbilical port site.  This fascial opening was then closed with a  running 0 Vicryl suture.  All port sites were injected with 0.25%  Marcaine and reapproximated at the skin level with staples.  Sterile  dressings were applied.  The patient appeared to tolerate the procedure  well without complications.  He was able to be extubated and transferred  to recovery unit in satisfactory condition.      Heloise Purpura, MD  Electronically Signed     LB/MEDQ  D:  04/24/2008  T:  04/24/2008  Job:  [id number]

## 2010-10-02 ENCOUNTER — Inpatient Hospital Stay: Payer: Self-pay | Admitting: Psychiatry

## 2010-11-16 ENCOUNTER — Inpatient Hospital Stay: Payer: Self-pay | Admitting: Internal Medicine

## 2010-11-25 ENCOUNTER — Emergency Department: Payer: Self-pay | Admitting: Emergency Medicine

## 2011-01-30 ENCOUNTER — Inpatient Hospital Stay: Payer: Self-pay | Admitting: Psychiatry

## 2011-02-09 ENCOUNTER — Emergency Department: Payer: Self-pay | Admitting: Emergency Medicine

## 2011-02-21 LAB — GLUCOSE, CAPILLARY
Glucose-Capillary: 174 mg/dL — ABNORMAL HIGH (ref 70–99)
Glucose-Capillary: 182 mg/dL — ABNORMAL HIGH (ref 70–99)
Glucose-Capillary: 214 mg/dL — ABNORMAL HIGH (ref 70–99)
Glucose-Capillary: 230 mg/dL — ABNORMAL HIGH (ref 70–99)
Glucose-Capillary: 234 mg/dL — ABNORMAL HIGH (ref 70–99)
Glucose-Capillary: 254 mg/dL — ABNORMAL HIGH (ref 70–99)

## 2011-02-21 LAB — BASIC METABOLIC PANEL
BUN: 8 mg/dL (ref 6–23)
CO2: 29 mEq/L (ref 19–32)
Chloride: 100 mEq/L (ref 96–112)
Creatinine, Ser: 0.93 mg/dL (ref 0.4–1.5)
Creatinine, Ser: 0.99 mg/dL (ref 0.4–1.5)
GFR calc Af Amer: >60 mL/min (ref >60)
GFR calc non Af Amer: >60 mL/min (ref >60)

## 2011-02-21 LAB — CBC
Hemoglobin: 15.1 g/dL (ref 13.0–17.0)
MCHC: 32.8 g/dL (ref 30.0–36.0)
MCV: 90.8 fL (ref 78.0–100.0)
Platelets: 185 10*3/uL (ref 150–400)
RBC: 5.08 MIL/uL (ref 4.22–5.81)
WBC: 15.7 10*3/uL — ABNORMAL HIGH (ref 4.0–10.5)

## 2011-02-21 LAB — HEMOGLOBIN AND HEMATOCRIT, BLOOD: HCT: 37.8 % — ABNORMAL LOW (ref 39.0–52.0)

## 2011-02-21 LAB — TYPE AND SCREEN: ABO/RH(D): A POS

## 2011-02-21 LAB — GLUCOSE, RANDOM: Glucose, Bld: 220 mg/dL — ABNORMAL HIGH (ref 70–99)

## 2011-02-22 ENCOUNTER — Emergency Department: Payer: Self-pay | Admitting: Emergency Medicine

## 2011-04-17 ENCOUNTER — Emergency Department: Payer: Self-pay | Admitting: Unknown Physician Specialty

## 2011-06-05 ENCOUNTER — Other Ambulatory Visit: Payer: Self-pay | Admitting: Orthopedic Surgery

## 2011-06-05 NOTE — H&P (Signed)
Gabriel [name] is an 56 y.o. male.   Chief Complaint: left knee pain HPI: Patient comes in complaining of pain in the left knee since may.  Knee constantly giving away and giving out.  Has seen several doctors for this and lucey was a second opinion.    MRI shows loose bodies in left knee  No past medical history on file.  No past surgical history on file.  No family history on file. Social History:  does not have a smoking history on file. He does not have any smokeless tobacco history on file. His alcohol and drug histories not on file.  Allergies: Allergies not on file  No current outpatient prescriptions on file as of 06/05/2011.   No current facility-administered medications on file as of 06/05/2011.    No results found for this or any previous visit (from the past 48 hour(s)). No results found.  Review of Systems  Constitutional: Negative.   HENT: Negative.   Eyes: Negative.   Respiratory: Negative.   Cardiovascular: Negative.   Gastrointestinal: Negative.   Genitourinary: Negative.   Musculoskeletal: Positive for joint pain.  Skin: Negative.   Neurological: Negative.   Endo/Heme/Allergies: Negative.   Psychiatric/Behavioral: Negative.     There were no vitals taken for this visit. Physical Exam  Constitutional: He is oriented to person, place, and time. He appears well-developed and well-nourished.  Cardiovascular: Normal rate and regular rhythm.   Respiratory: Effort normal and breath sounds normal.  GI: Soft. Bowel sounds are normal.  Musculoskeletal:       Left knee: He exhibits decreased range of motion, swelling, effusion and bony tenderness. tenderness found. Medial joint line tenderness noted.  Neurological: He is alert and oriented to person, place, and time.  Skin: Skin is warm and dry.     Assessment/Plan Loose bodies in left knee Left knee scope  Gabriel [name] 06/05/2011, 11:23 AM

## 2011-06-06 ENCOUNTER — Encounter (HOSPITAL_COMMUNITY): Payer: Self-pay | Admitting: Respiratory Therapy

## 2011-06-13 ENCOUNTER — Encounter (HOSPITAL_COMMUNITY): Payer: Self-pay

## 2011-06-13 NOTE — Progress Notes (Signed)
Reviewed history with Gabriel [name], requested obtain stress test and any cardio work up from ordering physician.  Dr. Luella Cook ordering physician per patients wife, attempted to obtain records from Dr. Vevelyn Royals office. Office closed early due to "inclement weather"

## 2011-06-15 MED ORDER — CHLORHEXIDINE GLUCONATE 4 % EX LIQD: 60.0000 mL | Freq: Once | CUTANEOUS | Status: DC

## 2011-06-16 ENCOUNTER — Ambulatory Visit (HOSPITAL_COMMUNITY): Payer: Medicare Other | Admitting: Anesthesiology

## 2011-06-16 ENCOUNTER — Encounter (HOSPITAL_COMMUNITY)
Admission: RE | Disposition: A | Discharge status: Home / Self Care | Payer: Self-pay | Source: Ambulatory Visit | Attending: Orthopedic Surgery

## 2011-06-16 ENCOUNTER — Ambulatory Visit (HOSPITAL_COMMUNITY)
Admission: RE | Admit: 2011-06-16 | Discharge: 2011-06-16 | Disposition: A | Discharge status: Home / Self Care | Payer: Medicare Other | Source: Ambulatory Visit | Attending: Orthopedic Surgery | Admitting: Orthopedic Surgery

## 2011-06-16 ENCOUNTER — Other Ambulatory Visit: Payer: Self-pay

## 2011-06-16 ENCOUNTER — Encounter (HOSPITAL_COMMUNITY): Payer: Self-pay | Admitting: Anesthesiology

## 2011-06-16 ENCOUNTER — Ambulatory Visit (HOSPITAL_COMMUNITY): Payer: Medicare Other

## 2011-06-16 ENCOUNTER — Encounter (HOSPITAL_COMMUNITY): Payer: Self-pay | Admitting: *Deleted

## 2011-06-16 DIAGNOSIS — M23359 Other meniscus derangements, posterior horn of lateral meniscus, unspecified knee: Secondary | ICD-10-CM | POA: Insufficient documentation

## 2011-06-16 DIAGNOSIS — M1712 Unilateral primary osteoarthritis, left knee: Secondary | ICD-10-CM

## 2011-06-16 DIAGNOSIS — F3289 Other specified depressive episodes: Secondary | ICD-10-CM | POA: Insufficient documentation

## 2011-06-16 DIAGNOSIS — F329 Major depressive disorder, single episode, unspecified: Secondary | ICD-10-CM | POA: Insufficient documentation

## 2011-06-16 DIAGNOSIS — Z01810 Encounter for preprocedural cardiovascular examination: Secondary | ICD-10-CM | POA: Insufficient documentation

## 2011-06-16 DIAGNOSIS — Z01818 Encounter for other preprocedural examination: Secondary | ICD-10-CM | POA: Insufficient documentation

## 2011-06-16 DIAGNOSIS — M2240 Chondromalacia patellae, unspecified knee: Secondary | ICD-10-CM | POA: Insufficient documentation

## 2011-06-16 DIAGNOSIS — F411 Generalized anxiety disorder: Secondary | ICD-10-CM | POA: Insufficient documentation

## 2011-06-16 DIAGNOSIS — M23305 Other meniscus derangements, unspecified medial meniscus, unspecified knee: Secondary | ICD-10-CM | POA: Insufficient documentation

## 2011-06-16 DIAGNOSIS — I252 Old myocardial infarction: Secondary | ICD-10-CM | POA: Insufficient documentation

## 2011-06-16 DIAGNOSIS — I2510 Atherosclerotic heart disease of native coronary artery without angina pectoris: Secondary | ICD-10-CM | POA: Insufficient documentation

## 2011-06-16 DIAGNOSIS — I10 Essential (primary) hypertension: Secondary | ICD-10-CM | POA: Insufficient documentation

## 2011-06-16 DIAGNOSIS — Z8673 Personal history of transient ischemic attack (TIA), and cerebral infarction without residual deficits: Secondary | ICD-10-CM | POA: Insufficient documentation

## 2011-06-16 DIAGNOSIS — E119 Type 2 diabetes mellitus without complications: Secondary | ICD-10-CM | POA: Insufficient documentation

## 2011-06-16 DIAGNOSIS — Z794 Long term (current) use of insulin: Secondary | ICD-10-CM | POA: Insufficient documentation

## 2011-06-16 DIAGNOSIS — M2390 Unspecified internal derangement of unspecified knee: Secondary | ICD-10-CM | POA: Insufficient documentation

## 2011-06-16 DIAGNOSIS — M1710 Unilateral primary osteoarthritis, unspecified knee: Secondary | ICD-10-CM | POA: Insufficient documentation

## 2011-06-16 HISTORY — DX: Headache: R51

## 2011-06-16 HISTORY — DX: Polyneuropathy, unspecified: G62.9

## 2011-06-16 HISTORY — DX: Depression, unspecified: F32.A

## 2011-06-16 HISTORY — DX: Malignant (primary) neoplasm, unspecified: C80.1

## 2011-06-16 HISTORY — DX: Essential (primary) hypertension: I10

## 2011-06-16 HISTORY — DX: Acute myocardial infarction, unspecified: I21.9

## 2011-06-16 HISTORY — DX: Personal history of other diseases of the digestive system: Z87.19

## 2011-06-16 HISTORY — DX: Anxiety disorder, unspecified: F41.9

## 2011-06-16 HISTORY — DX: Cerebral infarction, unspecified: I63.9

## 2011-06-16 HISTORY — DX: Major depressive disorder, single episode, unspecified: F32.9

## 2011-06-16 HISTORY — DX: Unspecified osteoarthritis, unspecified site: M19.90

## 2011-06-16 HISTORY — DX: Atherosclerotic heart disease of native coronary artery without angina pectoris: I25.10

## 2011-06-16 HISTORY — PX: KNEE ARTHROSCOPY: SHX127

## 2011-06-16 LAB — SURGICAL PCR SCREEN: MRSA, PCR: NEGATIVE

## 2011-06-16 LAB — CBC
MCH: 29.4 pg (ref 26.0–34.0)
MCHC: 32.5 g/dL (ref 30.0–36.0)
MCV: 90.5 fL (ref 78.0–100.0)
Platelets: 221 10*3/uL (ref 150–400)
RDW: 13 % (ref 11.5–15.5)

## 2011-06-16 LAB — BASIC METABOLIC PANEL
BUN: 16 mg/dL (ref 6–23)
Calcium: 9.5 mg/dL (ref 8.4–10.5)
Creatinine, Ser: 0.81 mg/dL (ref 0.50–1.35)
GFR calc non Af Amer: >90 mL/min (ref >90)
Glucose, Bld: 131 mg/dL — ABNORMAL HIGH (ref 70–99)
Sodium: 137 mEq/L (ref 135–145)

## 2011-06-16 LAB — GLUCOSE, CAPILLARY: Glucose-Capillary: 128 mg/dL — ABNORMAL HIGH (ref 70–99)

## 2011-06-16 SURGERY — ARTHROSCOPY, KNEE
Anesthesia: General | Site: Knee | Laterality: Left | Wound class: Clean

## 2011-06-16 MED ORDER — VANCOMYCIN HCL IN DEXTROSE 1-5 GM/200ML-% IV SOLN
INTRAVENOUS | Status: AC
  Filled 2011-06-16: qty 200

## 2011-06-16 MED ORDER — SODIUM CHLORIDE 0.9 % IR SOLN
Status: DC | PRN
  Administered 2011-06-16: 6000 mL

## 2011-06-16 MED ORDER — BUPIVACAINE-EPINEPHRINE 0.5% -1:200000 IJ SOLN
INTRAMUSCULAR | Status: DC | PRN
  Administered 2011-06-16: 20 mg

## 2011-06-16 MED ORDER — VANCOMYCIN HCL 1000 MG IV SOLR
1000.0000 mg | INTRAVENOUS | Status: DC | PRN
  Administered 2011-06-16: 1000 mg via INTRAVENOUS

## 2011-06-16 MED ORDER — LACTATED RINGERS IV SOLN
INTRAVENOUS | Status: DC | PRN
  Administered 2011-06-16: 12:00:00 via INTRAVENOUS

## 2011-06-16 MED ORDER — PROMETHAZINE HCL 25 MG/ML IJ SOLN: 6.2500 mg | INTRAMUSCULAR | Status: DC | PRN

## 2011-06-16 MED ORDER — PHENYLEPHRINE HCL 10 MG/ML IJ SOLN
INTRAMUSCULAR | Status: DC | PRN
  Administered 2011-06-16 (×2): 40 ug via INTRAVENOUS

## 2011-06-16 MED ORDER — PROPOFOL 10 MG/ML IV EMUL
INTRAVENOUS | Status: DC | PRN
  Administered 2011-06-16: 200 mg via INTRAVENOUS
  Administered 2011-06-16: 150 mg via INTRAVENOUS

## 2011-06-16 MED ORDER — ONDANSETRON HCL 4 MG/2ML IJ SOLN
INTRAMUSCULAR | Status: DC | PRN
  Administered 2011-06-16: 4 mg via INTRAVENOUS

## 2011-06-16 MED ORDER — LACTATED RINGERS IV SOLN
INTRAVENOUS | Status: DC
  Administered 2011-06-16: 12:00:00 via INTRAVENOUS

## 2011-06-16 MED ORDER — FENTANYL CITRATE 0.05 MG/ML IJ SOLN
INTRAMUSCULAR | Status: DC | PRN
  Administered 2011-06-16: 100 ug via INTRAVENOUS
  Administered 2011-06-16: 50 ug via INTRAVENOUS

## 2011-06-16 MED ORDER — LORAZEPAM 2 MG/ML IJ SOLN: 1.0000 mg | Freq: Once | INTRAMUSCULAR | Status: DC | PRN

## 2011-06-16 MED ORDER — MIDAZOLAM HCL 5 MG/5ML IJ SOLN
INTRAMUSCULAR | Status: DC | PRN
  Administered 2011-06-16: 2 mg via INTRAVENOUS

## 2011-06-16 MED ORDER — FENTANYL CITRATE 0.05 MG/ML IJ SOLN
50.0000 ug | INTRAMUSCULAR | Status: DC | PRN
  Administered 2011-06-16: 50 ug via INTRAVENOUS

## 2011-06-16 MED ORDER — HYDROMORPHONE HCL PF 1 MG/ML IJ SOLN: 0.2500 mg | INTRAMUSCULAR | Status: DC | PRN

## 2011-06-16 MED ORDER — MIDAZOLAM HCL 2 MG/2ML IJ SOLN: 1.0000 mg | INTRAMUSCULAR | Status: DC | PRN

## 2011-06-16 MED ORDER — MUPIROCIN 2 % EX OINT
TOPICAL_OINTMENT | CUTANEOUS | Status: AC
  Administered 2011-06-16: 1 via NASAL
  Filled 2011-06-16: qty 22

## 2011-06-16 SURGICAL SUPPLY — 48 items
BANDAGE ELASTIC 6 VELCRO ST LF (GAUZE/BANDAGES/DRESSINGS) ×2 IMPLANT
BANDAGE ESMARK 6X9 LF (GAUZE/BANDAGES/DRESSINGS) IMPLANT
BLADE CUDA 5.5 (BLADE) IMPLANT
BLADE CUTTER GATOR 3.5 (BLADE) IMPLANT
BLADE GREAT WHITE 4.2 (BLADE) ×2 IMPLANT
BNDG ESMARK 6X9 LF (GAUZE/BANDAGES/DRESSINGS)
BUR OVAL 6.0 (BURR) IMPLANT
CLOTH BEACON ORANGE TIMEOUT ST (SAFETY) ×2 IMPLANT
CUFF TOURNIQUET SINGLE 34IN LL (TOURNIQUET CUFF) ×2 IMPLANT
DRAPE ARTHROSCOPY W/POUCH 114 (DRAPES) ×2 IMPLANT
DRAPE U-SHAPE 47X51 STRL (DRAPES) ×2 IMPLANT
DRSG PAD ABDOMINAL 8X10 ST (GAUZE/BANDAGES/DRESSINGS) ×2 IMPLANT
ELECT CAUTERY BLADE 6.4 (BLADE) IMPLANT
ELECT MENISCUS 165MM 90D (ELECTRODE) IMPLANT
ELECT REM PT RETURN 9FT ADLT (ELECTROSURGICAL) ×2
ELECTRODE REM PT RTRN 9FT ADLT (ELECTROSURGICAL) ×1 IMPLANT
GAUZE XEROFORM 1X8 LF (GAUZE/BANDAGES/DRESSINGS) ×2 IMPLANT
GLOVE BIOGEL PI IND STRL 7.0 (GLOVE) ×2 IMPLANT
GLOVE BIOGEL PI IND STRL 7.5 (GLOVE) IMPLANT
GLOVE BIOGEL PI IND STRL 8.5 (GLOVE) ×2 IMPLANT
GLOVE BIOGEL PI INDICATOR 7.0 (GLOVE) ×2
GLOVE BIOGEL PI INDICATOR 7.5 (GLOVE)
GLOVE BIOGEL PI INDICATOR 8.5 (GLOVE) ×2
GLOVE SURG ORTHO 7.0 STRL STRW (GLOVE) IMPLANT
GLOVE SURG ORTHO 8.0 STRL STRW (GLOVE) ×4 IMPLANT
GLOVE SURG SS PI 6.5 STRL IVOR (GLOVE) ×2 IMPLANT
GOWN BRE IMP SLV AUR XL STRL (GOWN DISPOSABLE) ×4 IMPLANT
GOWN STRL NON-REIN LRG LVL3 (GOWN DISPOSABLE) ×4 IMPLANT
KIT ROOM TURNOVER OR (KITS) ×2 IMPLANT
MANIFOLD NEPTUNE II (INSTRUMENTS) ×2 IMPLANT
NEEDLE 18GX1X1/2 (RX/OR ONLY) (NEEDLE) ×2 IMPLANT
PACK ARTHROSCOPY DSU (CUSTOM PROCEDURE TRAY) ×2 IMPLANT
PAD ARMBOARD 7.5X6 YLW CONV (MISCELLANEOUS) ×4 IMPLANT
PADDING WEBRIL 6 STERILE (GAUZE/BANDAGES/DRESSINGS) ×2 IMPLANT
PENCIL BUTTON HOLSTER BLD 10FT (ELECTRODE) IMPLANT
SET ARTHROSCOPY TUBING (MISCELLANEOUS) ×1
SET ARTHROSCOPY TUBING LN (MISCELLANEOUS) ×1 IMPLANT
SPONGE GAUZE 4X4 12PLY (GAUZE/BANDAGES/DRESSINGS) ×2 IMPLANT
SPONGE LAP 4X18 X RAY DECT (DISPOSABLE) ×2 IMPLANT
SUT ETHILON 4 0 PS 2 18 (SUTURE) ×2 IMPLANT
SYR 30ML LL (SYRINGE) ×2 IMPLANT
SYR CONTROL 10ML LL (SYRINGE) ×2 IMPLANT
TOWEL OR 17X24 6PK STRL BLUE (TOWEL DISPOSABLE) ×2 IMPLANT
TOWEL OR 17X26 10 PK STRL BLUE (TOWEL DISPOSABLE) ×2 IMPLANT
TUBE CONNECTING 12X1/4 (SUCTIONS) ×2 IMPLANT
WAND 90 DEG TURBOVAC W/CORD (SURGICAL WAND) ×2 IMPLANT
WATER STERILE IRR 1000ML POUR (IV SOLUTION) ×2 IMPLANT
WRAP KNEE MAXI GEL POST OP (GAUZE/BANDAGES/DRESSINGS) ×2 IMPLANT

## 2011-06-16 NOTE — Transfer of Care (Signed)
Immediate Anesthesia Transfer of Care Note  Patient: Gabriel [name]  Procedure(s) Performed:  ARTHROSCOPY KNEE  Patient Location: PACU  Anesthesia Type: General  Level of Consciousness: sedated and patient cooperative  Airway & Oxygen Therapy: Patient Spontanous Breathing and Patient connected to nasal cannula oxygen  Post-op Assessment: Report given to PACU RN, Post -op Vital signs reviewed and stable and Patient moving all extremities  Post vital signs: Reviewed and stable  Complications: No apparent anesthesia complications

## 2011-06-16 NOTE — Interval H&P Note (Signed)
History and Physical Interval Note:  06/16/2011 9:00 AM  Gabriel [name]  has presented today for surgery, with the diagnosis of internal derangement, loose body left knee  The various methods of treatment have been discussed with the patient and family. After consideration of risks, benefits and other options for treatment, the patient has consented to  Procedure(s): ARTHROSCOPY KNEE as a surgical intervention .  The patients' history has been reviewed, patient examined, no change in status, stable for surgery.  I have reviewed the patients' chart and labs.  Questions were answered to the patient's satisfaction.     Gabriel [name],STEPHEN D

## 2011-06-16 NOTE — Anesthesia Preprocedure Evaluation (Addendum)
Anesthesia Evaluation  Patient identified by MRN, date of birth, ID band Patient awake    Reviewed: Allergy & Precautions, H&P , NPO status , Patient's Chart, lab work & pertinent test results  Airway Mallampati: II TM Distance: >3 FB Neck ROM: Full    Dental   Pulmonary    Pulmonary exam normal       Cardiovascular hypertension, + angina + CAD and + Past MI     Neuro/Psych  Headaches, Anxiety Depression  Neuromuscular disease CVA    GI/Hepatic hiatal hernia,   Endo/Other  Diabetes mellitus-, Well Controlled, Insulin DependentMorbid obesity  Renal/GU      Musculoskeletal   Abdominal   Peds  Hematology   Anesthesia Other Findings   Reproductive/Obstetrics                           Anesthesia Physical Anesthesia Plan  ASA: III  Anesthesia Plan: General   Post-op Pain Management:    Induction: Intravenous  Airway Management Planned: LMA  Additional Equipment:   Intra-op Plan:   Post-operative Plan: Extubation in OR  Informed Consent: I have reviewed the patients History and Physical, chart, labs and discussed the procedure including the risks, benefits and alternatives for the proposed anesthesia with the patient or authorized representative who has indicated his/her understanding and acceptance.     Plan Discussed with: CRNA and Surgeon  Anesthesia Plan Comments:         Anesthesia Quick Evaluation

## 2011-06-16 NOTE — H&P (View-Only) (Signed)
Gabriel [name] is an 55 y.o. male.   Chief Complaint: left knee pain HPI: Patient comes in complaining of pain in the left knee since may.  Knee constantly giving away and giving out.  Has seen several doctors for this and lucey was a second opinion.    MRI shows loose bodies in left knee  No past medical history on file.  No past surgical history on file.  No family history on file. Social History:  does not have a smoking history on file. He does not have any smokeless tobacco history on file. His alcohol and drug histories not on file.  Allergies: Allergies not on file  No current outpatient prescriptions on file as of 06/05/2011.   No current facility-administered medications on file as of 06/05/2011.    No results found for this or any previous visit (from the past 48 hour(s)). No results found.  Review of Systems  Constitutional: Negative.   HENT: Negative.   Eyes: Negative.   Respiratory: Negative.   Cardiovascular: Negative.   Gastrointestinal: Negative.   Genitourinary: Negative.   Musculoskeletal: Positive for joint pain.  Skin: Negative.   Neurological: Negative.   Endo/Heme/Allergies: Negative.   Psychiatric/Behavioral: Negative.     There were no vitals taken for this visit. Physical Exam  Constitutional: He is oriented to person, place, and time. He appears well-developed and well-nourished.  Cardiovascular: Normal rate and regular rhythm.   Respiratory: Effort normal and breath sounds normal.  GI: Soft. Bowel sounds are normal.  Musculoskeletal:       Left knee: He exhibits decreased range of motion, swelling, effusion and bony tenderness. tenderness found. Medial joint line tenderness noted.  Neurological: He is alert and oriented to person, place, and time.  Skin: Skin is warm and dry.     Assessment/Plan Loose bodies in left knee Left knee scope  Garnette Greb 06/05/2011, 11:23 AM    

## 2011-06-16 NOTE — Op Note (Signed)
Dictation number [id number]

## 2011-06-17 ENCOUNTER — Encounter (HOSPITAL_COMMUNITY): Payer: Self-pay | Admitting: Orthopedic Surgery

## 2011-06-17 MED FILL — Morphine Sulfate Inj 4 MG/ML: INTRAMUSCULAR | Qty: 1 | Status: AC

## 2011-06-17 NOTE — Anesthesia Postprocedure Evaluation (Signed)
  Anesthesia Post-op Note  Patient: Gabriel [name]  Procedure(s) Performed:  ARTHROSCOPY KNEE  Patient Location: PACU  Anesthesia Type: General  Level of Consciousness: awake and alert   Airway and Oxygen Therapy: Patient Spontanous Breathing  Post-op Pain: mild  Post-op Assessment: Post-op Vital signs reviewed, Patient's Cardiovascular Status Stable, Respiratory Function Stable, Patent Airway, No signs of Nausea or vomiting and Pain level controlled  Post-op Vital Signs: stable  Complications: No apparent anesthesia complications

## 2011-06-17 NOTE — Op Note (Signed)
NAMETUNIS, GENTLE NO.:  [id number]  MEDICAL RECORD NO.:  [id number]  LOCATION:  [facility]                         FACILITY:  [facility]  PHYSICIAN:  Mila Homer. Sherlean Foot, M.D. DATE OF BIRTH:  1956-05-14  DATE OF PROCEDURE:  06/16/2011 DATE OF DISCHARGE:  06/16/2011                              OPERATIVE REPORT   SURGEON:  Mila Homer. Sherlean Foot, MD  ASSISTANT:  None.  ANESTHESIA:  General LMA.  PREOPERATIVE DIAGNOSES:  Left knee osteoarthritis and internal derangement.  POSTOPERATIVE DIAGNOSES:  Left knee osteoarthritis and internal derangement.  PROCEDURE:  Left knee arthroscopy with ACL debridement and medial and lateral partial meniscectomy and loose body removal.  INDICATION FOR PROCEDURE:  The patient is a 56 year old with left knee pain with __________ symptoms.  Recent MRI showed only some gentle tearing, potentially in the meniscus, otherwise nothing else in the knee meniscus cartilage tears as well as loose bodies, and concern about the ACL.  DESCRIPTION OF PROCEDURE:  The patient was laid supine, administered general anesthesia.  Left leg was prepped and draped in the usual sterile fashion.  Inferolateral and inferomedial portals were created with #11 blade, blunt trocar, and cannula.  Diagnostic arthroscopy revealed no chondromalacia of the patellofemoral joint, grade 2-3 in the medial compartment, grade 2-3 in the lateral compartment.  I performed chondroplasty and plicectomy of the patellofemoral compartment.  In the medial compartment, there was a subluxed medial meniscus tear.  It was through the __________, so I amputated anteriorly and posteriorly, debrided that edges with a straight basket forceps and removed debris with a Biochemist, clinical.  Going into the notch, the ACL was torn and it was completely torn off the lateral wall impinging into the lateral compartment.  I debrided this completely and this was completely missed by MRI scan.  Then,  when __________ position, the entirety of the lateral meniscus was completely emaciated with multiple radial tears.  I debrided nearly the entirety of the posterior horn and mid body.  Only the anterior horn remained.  I then lavaged and closed with 4-0 nylon sutures.  Dressed with Xeroform dressing sponges, sterile Webril, and Ace wraps.  COMPLICATIONS:  None.  DRAINS:  None.          ______________________________ Mila Homer. Sherlean Foot, M.D.     SDL/MEDQ  D:  06/16/2011  T:  06/17/2011  Job:  [id number]

## 2011-11-15 ENCOUNTER — Emergency Department: Payer: Self-pay | Admitting: Emergency Medicine

## 2011-11-15 LAB — COMPREHENSIVE METABOLIC PANEL
Albumin: 3.9 g/dL (ref 3.4–5.0)
Alkaline Phosphatase: 101 U/L (ref 50–136)
Anion Gap: 7 (ref 7–16)
BUN: 17 mg/dL (ref 7–18)
Bilirubin,Total: 0.3 mg/dL (ref 0.2–1.0)
Calcium, Total: 8.9 mg/dL (ref 8.5–10.1)
Chloride: 107 mmol/L (ref 98–107)
Creatinine: 1.14 mg/dL (ref 0.60–1.30)
EGFR (African American): >60
EGFR (Non-African Amer.): >60
Osmolality: 286 (ref 275–301)
Potassium: 4 mmol/L (ref 3.5–5.1)
Total Protein: 7.6 g/dL (ref 6.4–8.2)

## 2011-11-15 LAB — ETHANOL: Ethanol %: <0.003 % (ref 0.000–0.080)

## 2011-11-15 LAB — DRUG SCREEN, URINE
Amphetamines, Ur Screen: NEGATIVE (ref ?–1000)
Cannabinoid 50 Ng, Ur ~~LOC~~: NEGATIVE (ref ?–50)
MDMA (Ecstasy)Ur Screen: NEGATIVE (ref ?–500)
Methadone, Ur Screen: NEGATIVE (ref ?–300)
Opiate, Ur Screen: NEGATIVE (ref ?–300)
Phencyclidine (PCP) Ur S: NEGATIVE (ref ?–25)
Tricyclic, Ur Screen: NEGATIVE (ref ?–1000)

## 2011-11-15 LAB — URINALYSIS, COMPLETE
Bilirubin,UR: NEGATIVE
Blood: NEGATIVE
Glucose,UR: 150 mg/dL (ref 0–75)
Leukocyte Esterase: NEGATIVE
Nitrite: NEGATIVE
Ph: 5 (ref 4.5–8.0)
Protein: 100
Specific Gravity: 1.025 (ref 1.003–1.030)

## 2011-11-15 LAB — TROPONIN I: Troponin-I: <0.02 ng/mL

## 2011-11-15 LAB — CBC
HGB: 12.3 g/dL — ABNORMAL LOW (ref 13.0–18.0)
MCHC: 32.9 g/dL (ref 32.0–36.0)
MCV: 89 fL (ref 80–100)
RBC: 4.19 10*6/uL — ABNORMAL LOW (ref 4.40–5.90)

## 2012-01-10 ENCOUNTER — Emergency Department: Payer: Self-pay | Admitting: Emergency Medicine

## 2012-01-10 LAB — BASIC METABOLIC PANEL
BUN: 16 mg/dL (ref 7–18)
Calcium, Total: 9.1 mg/dL (ref 8.5–10.1)
Chloride: 104 mmol/L (ref 98–107)
Co2: 24 mmol/L (ref 21–32)
Creatinine: 1.31 mg/dL — ABNORMAL HIGH (ref 0.60–1.30)
EGFR (African American): >60
EGFR (Non-African Amer.): >60
Glucose: 254 mg/dL — ABNORMAL HIGH (ref 65–99)
Potassium: 4.1 mmol/L (ref 3.5–5.1)
Sodium: 137 mmol/L (ref 136–145)

## 2012-01-10 LAB — CBC
MCHC: 33 g/dL (ref 32.0–36.0)
MCV: 92 fL (ref 80–100)
Platelet: 204 10*3/uL (ref 150–440)
RBC: 4.27 10*6/uL — ABNORMAL LOW (ref 4.40–5.90)
RDW: 13.8 % (ref 11.5–14.5)
WBC: 10 10*3/uL (ref 3.8–10.6)

## 2012-01-10 LAB — TROPONIN I: Troponin-I: <0.02 ng/mL

## 2012-01-21 ENCOUNTER — Ambulatory Visit: Payer: Self-pay | Admitting: Internal Medicine

## 2012-05-19 ENCOUNTER — Emergency Department: Payer: Self-pay | Admitting: Emergency Medicine

## 2012-05-19 LAB — CBC WITH DIFFERENTIAL/PLATELET
Basophil #: 0.1 10*3/uL (ref 0.0–0.1)
Eosinophil %: 2.5 %
HCT: 36.3 % — ABNORMAL LOW (ref 40.0–52.0)
HGB: 12.3 g/dL — ABNORMAL LOW (ref 13.0–18.0)
Lymphocyte #: 2.4 10*3/uL (ref 1.0–3.6)
Lymphocyte %: 27.4 %
MCH: 30.3 pg (ref 26.0–34.0)
MCV: 90 fL (ref 80–100)
Monocyte %: 6.4 %
Neutrophil #: 5.5 10*3/uL (ref 1.4–6.5)
Neutrophil %: 62.9 %
Platelet: 181 10*3/uL (ref 150–440)
RBC: 4.06 10*6/uL — ABNORMAL LOW (ref 4.40–5.90)
WBC: 8.7 10*3/uL (ref 3.8–10.6)

## 2012-05-19 LAB — COMPREHENSIVE METABOLIC PANEL
Albumin: 3.8 g/dL (ref 3.4–5.0)
Alkaline Phosphatase: 99 U/L (ref 50–136)
BUN: 16 mg/dL (ref 7–18)
Bilirubin,Total: 0.3 mg/dL (ref 0.2–1.0)
Co2: 25 mmol/L (ref 21–32)
Creatinine: 1.05 mg/dL (ref 0.60–1.30)
EGFR (Non-African Amer.): >60
Glucose: 285 mg/dL — ABNORMAL HIGH (ref 65–99)
Osmolality: 287 (ref 275–301)
Sodium: 138 mmol/L (ref 136–145)
Total Protein: 7.2 g/dL (ref 6.4–8.2)

## 2012-05-19 LAB — TROPONIN I: Troponin-I: <0.02 ng/mL

## 2012-06-25 ENCOUNTER — Ambulatory Visit: Payer: Self-pay | Admitting: Internal Medicine

## 2012-07-17 ENCOUNTER — Ambulatory Visit: Payer: Self-pay | Admitting: Internal Medicine

## 2012-10-19 ENCOUNTER — Emergency Department: Payer: Self-pay | Admitting: Emergency Medicine

## 2012-11-02 ENCOUNTER — Ambulatory Visit (INDEPENDENT_AMBULATORY_CARE_PROVIDER_SITE_OTHER): Payer: Medicare Other | Admitting: Radiology

## 2012-11-02 DIAGNOSIS — G609 Hereditary and idiopathic neuropathy, unspecified: Secondary | ICD-10-CM

## 2012-11-02 NOTE — Procedures (Signed)
History:  57yo male is referred for nerve conduction only for evaluation of peripheral neuropathy.  Nerve Conduction Study:   right sural sensory response was absent. Left sural sensory response showed mild SNAP amplitude.  Bilateral median sensory, motor responses were normal.  Bilateral radial sensory responses were normal.  Right ulnar sensory and motor responses were normal.   Left ulnar sensory response was absent. Left ulnar motor response showed moderately decreased CMAP amplitude, with normal distal latency, mild slow conduction velocity, 5 m/sec slowing across the left elbow, severely prolonged F-wave latency.   In Conclusion: This is an abnormal study.  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

## 2012-11-11 ENCOUNTER — Ambulatory Visit (INDEPENDENT_AMBULATORY_CARE_PROVIDER_SITE_OTHER): Payer: Medicare Other | Admitting: Neurology

## 2012-11-11 ENCOUNTER — Encounter: Payer: Self-pay | Admitting: Neurology

## 2012-11-11 VITALS — BP 137/81 | HR 86 | Ht 68.0 in | Wt 224.0 lb

## 2012-11-11 DIAGNOSIS — I6350 Cerebral infarction due to unspecified occlusion or stenosis of unspecified cerebral artery: Secondary | ICD-10-CM

## 2012-11-11 DIAGNOSIS — M545 Low back pain, unspecified: Secondary | ICD-10-CM

## 2012-11-11 DIAGNOSIS — I639 Cerebral infarction, unspecified: Secondary | ICD-10-CM | POA: Insufficient documentation

## 2012-11-11 DIAGNOSIS — R269 Unspecified abnormalities of gait and mobility: Secondary | ICD-10-CM | POA: Insufficient documentation

## 2012-11-11 NOTE — Progress Notes (Signed)
[facility]  PATIENT: Gabriel [name] DOB: Dec 13, 1955  HISTORICAL  Mrs. Delton See is a 57 years old right-handed African American male, accompanied by his wife, referred by his primary care physician Dr. Tasia Catchings for evaluation of low back pain, gait difficulty, frequent falling  He had a past medical history of hypertension, diabetes, hyperlipidemia, coronary artery disease, stroke in 2012, was treated at [facility], had mild left hemiparesis, left visual deficit, worsening gait difficulty since.  He had long-standing history of chronic low back pain, started in 1987, after picking up a heavy bag of nickels, walking up the ramp, he developed low back pain, shooting pain to her right leg, he had one through 5 lumbar decompression with rod placement surgery at [place] in 1997 but to his worsening low back pain, in 1998, the rod was taken out,  he had a successful fusion of lumbar spine.  Over the years, he continued to have low back pain, gait difficulty, receiving pain medication through his primary care physician, he fell twice over the past few months, both lower extremity give out underneath him, he continued to have low back pain, shooting pain to her right posterior leg, to posterior calf,  He had bladder surgery, has urinary incontinence, he also complains of memory trouble, bilateral fingertips paresthesia,  He had nerve conduction in November 02 2012, showed length dependent axonal peripheral neuropathy, evidence of left ulnar neuropathy   REVIEW OF SYSTEMS: Full 14 system review of systems performed and notable only for fatigue, spinning sensation, blurred vision, double vision, eye pain, snoring, diarrhea, constipation, joints pain, aching muscles, memory loss, confusion, headaches, numbness, weakness, slurred speech, dizziness, passing out, snoring, restless leg, depression, anxiety, change in appetite   ALLERGIES: Allergies  Allergen Reactions  .  Penicillins Swelling and Rash    HOME MEDICATIONS: Outpatient Prescriptions Prior to Visit  Medication Sig Dispense Refill  . aspirin 81 MG chewable tablet Chew 325 mg by mouth daily.       Marland Kitchen atorvastatin (LIPITOR) 10 MG tablet Take 10 mg by mouth at bedtime.      Marland Kitchen HYDROcodone-acetaminophen (NORCO) 5-325 MG per tablet Take 7.5 tablets by mouth every 4 (four) hours as needed. For pain      . insulin aspart protamine-insulin aspart (NOVOLOG 70/30) (70-30) 100 UNIT/ML injection Inject 65 Units into the skin 2 (two) times daily with a meal.      . metFORMIN (GLUCOPHAGE) 1000 MG tablet Take 1,000 mg by mouth 2 (two) times daily with a meal.      . OVER THE COUNTER MEDICATION Take 1 capsule by mouth every other day. Stool softner      . pregabalin (LYRICA) 150 MG capsule Take 150 mg by mouth 2 (two) times daily.      . ramipril (ALTACE) 2.5 MG capsule Take 2.5 mg by mouth daily.      Marland Kitchen senna (SENOKOT) 8.6 MG tablet Take 1 tablet by mouth every other day.      . sertraline (ZOLOFT) 100 MG tablet Take 100 mg by mouth daily.      . sitaGLIPtin (JANUVIA) 100 MG tablet Take 100 mg by mouth daily.      . vitamin B-12 (CYANOCOBALAMIN) 500 MCG tablet Take 500 mcg by mouth daily.      . carbamazepine (EPITOL) 200 MG tablet Take 200 mg by mouth daily.      . celecoxib (CELEBREX) 200 MG capsule Take 400 mg by mouth 2 (two) times daily.      Marland Kitchen  HYDROmorphone (DILAUDID) 2 MG tablet Take 2-4 mg by mouth every 4 (four) hours as needed. For severe pain      . traMADol-acetaminophen (ULTRACET) 37.5-325 MG per tablet Take 1 tablet by mouth every 6 (six) hours as needed. For pain      . traZODone (DESYREL) 50 MG tablet Take 50-100 mg by mouth at bedtime.       No facility-administered medications prior to visit.    PAST MEDICAL HISTORY: Past Medical History  Diagnosis Date  . Myocardial infarction     approx 2001, no cardiologist  . Stroke     June 2012  . Diabetes mellitus   . H/O hiatal hernia   .  Headache(784.0)   . Neuropathy   . Cancer     prostate  . Arthritis   . Anxiety   . Depression   . Coronary artery disease     per H&P from Dr. Heloise Purpura  . Hypertension     sees Dr. Ellsworth Lennox, primary    PAST SURGICAL HISTORY: Past Surgical History  Procedure Laterality Date  . Back surgery      1997 and 1998  . Hernia repair    . Cholecystectomy    . Knee surgery    . Appendectomy    . Prostate surgery  2010  . Knee arthroscopy  06/16/2011    Procedure: ARTHROSCOPY KNEE;  Surgeon: Raymon Mutton, MD;  Location: [facility] OR;  Service: Orthopedics;  Laterality: Left;    FAMILY HISTORY: Family History  Problem Relation Age of Onset  . Arthritis Mother   . Diabetes Father   . Arthritis Brother     SOCIAL HISTORY:  History   Social History  . Marital Status: Married    Spouse Name: Charisse    Number of Children: 3  . Years of Education: 12   Occupational History  .      disabled   Social History Main Topics  . Smoking status: Never Smoker   . Smokeless tobacco: Never Used  . Alcohol Use: No  . Drug Use: No  . Sexually Active: Not on file   Other Topics Concern  . Not on file   Social History Narrative   Patient lives at home with his wife Billey Gosling). Patient is disabled. Patient has high school education. Three children.   Right handed.   Caffeine - 2 cups sometimes.     PHYSICAL EXAM  Filed Vitals:   11/11/12 0927  BP: 137/81  Pulse: 86  Height: 5\' 8"  (1.727 m)  Weight: 224 lb (101.606 kg)   Body mass index is 34.07 kg/(m^2).   Generalized: In no acute distress  Neck: Supple, no carotid bruits   Cardiac: Regular rate rhythm  Pulmonary: Clear to auscultation bilaterally  Musculoskeletal: No deformity  Neurological examination  Mentation: Slow to response, mini-mental status 21/30, he is not oriented to date, place, missed 3/3 recall, could not spell WORLD backward, has difficulty repeating  Cranial nerve II-XII: Pupils were equal  round reactive to light extraocular movements were full, Left visual field cut on confrontational test. facial sensation and strength were normal. hearing was intact to finger rubbing bilaterally. Uvula tongue midline.  head turning and shoulder shrug and were normal and symmetric.Tongue protrusion into cheek strength was normal.  Motor: he has mild left arm fixation on rapid rotating movements, mild left intrinsic hand muscle atrophy, he has left finger abduction weakness, mild left hip flexion, ankle dorsiflexion weakness  Sensory: length dependent decreased  fine touch, pinprick at feet and ankle, preserved vibratory sensation, and proprioception at toes. He has decreased light touch at the left fourth and fifth fingers  Coordination: Normal finger to nose, heel-to-shin bilaterally there was no truncal ataxia  Gait: atalgic cautious, dragging left leg   Romberg signs: Negative  Deep tendon reflexes: Brachioradialis 2/2, biceps 2/2, triceps 2/2, patellar 2/2, Achilles 0/0, plantar responses were flexor bilaterally.   DIAGNOSTIC DATA (LABS, IMAGING, TESTING) - I reviewed patient records, labs, notes, testing and imaging myself where available.  Lab Results  Component Value Date   WBC 11.0* 06/16/2011   HGB 12.7* 06/16/2011   HCT 39.1 06/16/2011   MCV 90.5 06/16/2011   PLT 221 06/16/2011      Component Value Date/Time   NA 137 06/16/2011 1034   K 4.3 06/16/2011 1034   CL 103 06/16/2011 1034   CO2 25 06/16/2011 1034   GLUCOSE 131* 06/16/2011 1034   BUN 16 06/16/2011 1034   CREATININE 0.81 06/16/2011 1034   CALCIUM 9.5 06/16/2011 1034   PROT 6.4 06/01/2008 2200   ALBUMIN 3.6 06/01/2008 2200   AST 19 06/01/2008 2200   ALT 16 06/01/2008 2200   ALKPHOS 70 06/01/2008 2200   BILITOT 0.9 06/01/2008 2200   GFRNONAA >90 06/16/2011 1034   GFRAA >90 06/16/2011 1034   Lab Results  Component Value Date   CHOL  Value: 176        ATP III CLASSIFICATION:  <200     mg/dL   Desirable  161-096  mg/dL    Borderline High  >=045    mg/dL   High        08/25/8117   HDL 29* 06/03/2008   LDLCALC  Value: 101        Total Cholesterol/HDL:CHD Risk Coronary Heart Disease Risk Table                     Men   Women  1/2 Average Risk   3.4   3.3  Average Risk       5.0   4.4  2 X Average Risk   9.6   7.1  3 X Average Risk  23.4   11.0        Use the calculated Patient Ratio above and the CHD Risk Table to determine the patient's CHD Risk.        ATP III CLASSIFICATION (LDL):  <100     mg/dL   Optimal  147-829  mg/dL   Near or Above                    Optimal  130-159  mg/dL   Borderline  562-130  mg/dL   High  >865     mg/dL   Very High* 7/84/6962   TRIG 229* 06/03/2008   CHOLHDL 6.1 06/03/2008   Lab Results  Component Value Date   HGBA1C  Value: 11.1 (NOTE)   The ADA recommends the following therapeutic goal for glycemic   control related to Hgb A1C measurement:   Goal of Therapy:   < 7.0% Hgb A1C   Reference: American Diabetes Association: Clinical Practice   Recommendations 2008, Diabetes Care,  2008, 31:(Suppl 1).* 06/03/2008   No results found for this basename: VITAMINB12       ASSESSMENT AND PLAN   57 years old right-handed [place] American male, with past medical history of diabetes, coronary artery disease, strokes, low back surgery twice, now presenting with persistent low  back pain, gait difficulty, mild left hemiparesis, left visual field deficit,  1 his frequent falling, gait difficulty are multifactories, including stroke, left hemiparesis, low back pain,  2. I will refer him to physical therapy, MRI of lumbar to evaluate his lumbar stenosis   Orders Placed This Encounter  Procedures  . MR Lumbar Spine Wo Contrast  . Ambulatory referral to Physical Therapy     Levert Feinstein, M.D. Ph.D.  [facility] [address] [place], [place] [address] [phone]

## 2012-12-16 ENCOUNTER — Ambulatory Visit
Admission: RE | Admit: 2012-12-16 | Discharge: 2012-12-16 | Disposition: A | Discharge status: Home / Self Care | Payer: Medicare Other | Source: Ambulatory Visit | Attending: Neurology | Admitting: Neurology

## 2012-12-16 DIAGNOSIS — R269 Unspecified abnormalities of gait and mobility: Secondary | ICD-10-CM

## 2012-12-16 DIAGNOSIS — M545 Low back pain, unspecified: Secondary | ICD-10-CM

## 2012-12-16 DIAGNOSIS — I639 Cerebral infarction, unspecified: Secondary | ICD-10-CM

## 2013-01-05 NOTE — Progress Notes (Signed)
Quick Note:  Please call him post surgical changes on MRI lumbar, will have further discussion on follow up visit in Oct 2014 ______

## 2013-01-06 ENCOUNTER — Telehealth: Payer: Self-pay

## 2013-01-06 NOTE — Telephone Encounter (Signed)
I Called and spoke with patient's wife. I let her know that the patient's MRI shows some post surgical changes. Dr. Terrace Arabia will discuss what that means in more detail when she sees patient in October. Next OV Oct. 13th at 2:30 p.m.

## 2013-01-06 NOTE — Telephone Encounter (Signed)
Message copied by [facility] on Thu Jan 06, 2013  8:33 AM ------      Message from: Levert Feinstein      Created: Wed Jan 05, 2013  4:48 PM       Please call him post surgical changes on MRI lumbar, will have further discussion on follow up visit in Oct 2014 ------

## 2013-01-14 ENCOUNTER — Ambulatory Visit: Payer: Medicare Other | Attending: Neurology

## 2013-01-14 DIAGNOSIS — R262 Difficulty in walking, not elsewhere classified: Secondary | ICD-10-CM | POA: Insufficient documentation

## 2013-01-14 DIAGNOSIS — Z9181 History of falling: Secondary | ICD-10-CM | POA: Insufficient documentation

## 2013-01-14 DIAGNOSIS — IMO0001 Reserved for inherently not codable concepts without codable children: Secondary | ICD-10-CM | POA: Insufficient documentation

## 2013-01-25 ENCOUNTER — Ambulatory Visit: Payer: Medicare Other | Admitting: Physical Therapy

## 2013-01-27 ENCOUNTER — Ambulatory Visit: Payer: Medicare Other | Attending: Neurology

## 2013-01-27 DIAGNOSIS — R262 Difficulty in walking, not elsewhere classified: Secondary | ICD-10-CM | POA: Insufficient documentation

## 2013-01-27 DIAGNOSIS — IMO0001 Reserved for inherently not codable concepts without codable children: Secondary | ICD-10-CM | POA: Insufficient documentation

## 2013-01-27 DIAGNOSIS — Z9181 History of falling: Secondary | ICD-10-CM | POA: Insufficient documentation

## 2013-02-01 ENCOUNTER — Ambulatory Visit: Payer: Medicare Other | Admitting: Physical Therapy

## 2013-02-03 ENCOUNTER — Ambulatory Visit: Payer: Medicare Other

## 2013-02-08 ENCOUNTER — Ambulatory Visit: Payer: Medicare Other | Admitting: Physical Therapy

## 2013-02-10 ENCOUNTER — Ambulatory Visit: Payer: Medicare Other

## 2013-02-15 ENCOUNTER — Ambulatory Visit: Payer: Medicare Other

## 2013-02-17 ENCOUNTER — Ambulatory Visit: Payer: Medicare Other | Attending: Neurology

## 2013-02-17 DIAGNOSIS — Z9181 History of falling: Secondary | ICD-10-CM | POA: Insufficient documentation

## 2013-02-17 DIAGNOSIS — IMO0001 Reserved for inherently not codable concepts without codable children: Secondary | ICD-10-CM | POA: Insufficient documentation

## 2013-02-17 DIAGNOSIS — R262 Difficulty in walking, not elsewhere classified: Secondary | ICD-10-CM | POA: Insufficient documentation

## 2013-02-24 ENCOUNTER — Ambulatory Visit: Payer: Medicare Other

## 2013-02-28 ENCOUNTER — Emergency Department: Payer: Self-pay | Admitting: Emergency Medicine

## 2013-02-28 ENCOUNTER — Ambulatory Visit: Payer: Medicare Other | Admitting: Neurology

## 2013-02-28 LAB — BASIC METABOLIC PANEL
Anion Gap: 10 (ref 7–16)
Calcium, Total: 9.3 mg/dL (ref 8.5–10.1)
Co2: 24 mmol/L (ref 21–32)
EGFR (African American): >60
Glucose: 274 mg/dL — ABNORMAL HIGH (ref 65–99)
Osmolality: 284 (ref 275–301)
Potassium: 4.3 mmol/L (ref 3.5–5.1)
Sodium: 136 mmol/L (ref 136–145)

## 2013-02-28 LAB — CBC
HCT: 37.8 % — ABNORMAL LOW (ref 40.0–52.0)
HGB: 12.4 g/dL — ABNORMAL LOW (ref 13.0–18.0)
MCH: 29.6 pg (ref 26.0–34.0)
MCV: 90 fL (ref 80–100)
RBC: 4.2 10*6/uL — ABNORMAL LOW (ref 4.40–5.90)
RDW: 13.4 % (ref 11.5–14.5)

## 2013-02-28 LAB — TROPONIN I: Troponin-I: <0.02 ng/mL

## 2013-03-03 ENCOUNTER — Ambulatory Visit: Payer: Medicare Other

## 2013-03-03 ENCOUNTER — Encounter: Payer: Self-pay | Admitting: Neurology

## 2013-03-03 ENCOUNTER — Ambulatory Visit (INDEPENDENT_AMBULATORY_CARE_PROVIDER_SITE_OTHER): Payer: Medicare Other | Admitting: Neurology

## 2013-03-03 VITALS — BP 125/80 | HR 83 | Ht 68.0 in | Wt 224.0 lb

## 2013-03-03 DIAGNOSIS — M545 Low back pain, unspecified: Secondary | ICD-10-CM

## 2013-03-03 DIAGNOSIS — R269 Unspecified abnormalities of gait and mobility: Secondary | ICD-10-CM

## 2013-03-03 DIAGNOSIS — I639 Cerebral infarction, unspecified: Secondary | ICD-10-CM

## 2013-03-03 DIAGNOSIS — I6350 Cerebral infarction due to unspecified occlusion or stenosis of unspecified cerebral artery: Secondary | ICD-10-CM

## 2013-03-03 MED ORDER — MELOXICAM 7.5 MG PO TABS: 7.5000 mg | ORAL_TABLET | Freq: Two times a day (BID) | ORAL | Status: DC

## 2013-03-03 NOTE — Progress Notes (Signed)
[facility]  PATIENT: Gabriel [name] DOB: 06-Jul-1955  HISTORICAL  Gabriel [name] is a 57 years old right-handed African American male, accompanied by his wife, referred by his primary care physician Dr. Tasia Catchings for evaluation of low back pain, gait difficulty, frequent falling  He had a past medical history of hypertension, diabetes, hyperlipidemia, coronary artery disease, stroke in 2012, was treated at [facility], had mild left hemiparesis, left visual deficit, worsening gait difficulty since.  He had long-standing history of chronic low back pain, started in 1987, after picking up a heavy bag of nickels, walking up the ramp, he developed low back pain, shooting pain to her right leg, he had L1-5 lumbar decompression with rod placement surgery at [place] in 1997, but due to his worsening low back pain, in 1998, the rod was taken out,  he had a successful fusion of lumbar spine.  Over the years, he continued to have low back pain, gait difficulty, receiving pain medication through his primary care physician, he fell twice over the past few months, both lower extremity give out underneath him, he continued to have low back pain, shooting pain to her right posterior leg, to posterior calf,  He had bladder surgery, has urinary incontinence, he also complains of memory trouble, bilateral fingertips paresthesia,  He had nerve conduction in November 02 2012, showed length dependent axonal peripheral neuropathy, evidence of left ulnar neuropathy  UPDATE Mar 03, 2013: He is on disability due to low back pain, he also had left knee surgery in past, still has left knee pain, he has history of stroke with left side weakness.   MRI lumbar showed mild disc and facet degenerative changes throughout resulting in bilateral foraminal narrowing from L3-4 to L5-S1 with postoperative changes of posterior fusion and decompression from L2-L5. A metallic artifact is noted at the left L3-4  facet joint with probable protusion into the posterior spinal canal at that level.  He is receiving physical therapy, with mild improvement, he has left knee brace on   REVIEW OF SYSTEMS: Full 14 system review of systems performed and notable only for weight loss, fatigue, chest pain, rash, blurred vision, double vision, shortness of breath, snoring, importance, feeling cold, joint pain, joint swelling, cramps, achy muscles, memory loss, confusion, headaches, numbness, weakness, dizziness, seizure, tremor, insomnia, sleepiness, snoring, restless leg, anxiety  ALLERGIES: Allergies  Allergen Reactions  . Penicillins Swelling and Rash    HOME MEDICATIONS: Outpatient Prescriptions Prior to Visit  Medication Sig Dispense Refill  . aspirin 81 MG chewable tablet Chew 325 mg by mouth daily.       Marland Kitchen atorvastatin (LIPITOR) 10 MG tablet Take 10 mg by mouth at bedtime.      . cyclobenzaprine (FLEXERIL) 10 MG tablet Take 10 mg by mouth daily.      Marland Kitchen HYDROcodone-acetaminophen (NORCO) 5-325 MG per tablet Take 7.5 tablets by mouth every 4 (four) hours as needed. For pain      . insulin aspart protamine-insulin aspart (NOVOLOG 70/30) (70-30) 100 UNIT/ML injection Inject 65 Units into the skin 2 (two) times daily with a meal.      . metFORMIN (GLUCOPHAGE) 1000 MG tablet Take 1,000 mg by mouth 2 (two) times daily with a meal.      . nabumetone (RELAFEN) 500 MG tablet Take 500 mg by mouth daily.      Marland Kitchen OVER THE COUNTER MEDICATION Take 1 capsule by mouth every other day. Stool softner      .  pregabalin (LYRICA) 150 MG capsule Take 150 mg by mouth 2 (two) times daily.      . ramipril (ALTACE) 2.5 MG capsule Take 2.5 mg by mouth daily.      Marland Kitchen senna (SENOKOT) 8.6 MG tablet Take 1 tablet by mouth every other day.      . sertraline (ZOLOFT) 100 MG tablet Take 100 mg by mouth daily.      . sitaGLIPtin (JANUVIA) 100 MG tablet Take 100 mg by mouth daily.      . vitamin B-12 (CYANOCOBALAMIN) 500 MCG tablet Take 500  mcg by mouth daily.       No facility-administered medications prior to visit.    PAST MEDICAL HISTORY: Past Medical History  Diagnosis Date  . Myocardial infarction     approx 2001, no cardiologist  . Stroke     June 2012  . Diabetes mellitus   . H/O hiatal hernia   . Headache(784.0)   . Neuropathy   . Cancer     prostate  . Arthritis   . Anxiety   . Depression   . Coronary artery disease     per H&P from Dr. Heloise Purpura  . Hypertension     sees Dr. Ellsworth Lennox, primary    PAST SURGICAL HISTORY: Past Surgical History  Procedure Laterality Date  . Back surgery      1997 and 1998  . Hernia repair    . Cholecystectomy    . Knee surgery    . Appendectomy    . Prostate surgery  2010  . Knee arthroscopy  06/16/2011    Procedure: ARTHROSCOPY KNEE;  Surgeon: Raymon Mutton, MD;  Location: [facility] OR;  Service: Orthopedics;  Laterality: Left;    FAMILY HISTORY: Family History  Problem Relation Age of Onset  . Arthritis Mother   . Diabetes Father   . Arthritis Brother     SOCIAL HISTORY:  History   Social History  . Marital Status: Married    Spouse Name: Charisse    Number of Children: 3  . Years of Education: 12   Occupational History  .      disabled   Social History Main Topics  . Smoking status: Never Smoker   . Smokeless tobacco: Never Used  . Alcohol Use: No  . Drug Use: No  . Sexual Activity: Not on file   Other Topics Concern  . Not on file   Social History Narrative   Patient lives at home with his wife Billey Gosling). Patient is disabled. Patient has high school education. Three children.   Right handed.   Caffeine - 2 cups sometimes.     PHYSICAL EXAM  Filed Vitals:   03/03/13 0906  BP: 125/80  Pulse: 83  Height: 5\' 8"  (1.727 m)  Weight: 224 lb (101.606 kg)   Body mass index is 34.07 kg/(m^2).   Generalized: In no acute distress  Neck: Supple, no carotid bruits   Cardiac: Regular rate rhythm  Pulmonary: Clear to auscultation  bilaterally  Musculoskeletal: No deformity  Neurological examination  Mentation: Slow to response, mini-mental status 28/30, he is not oriented to date, has difficulty copy design Cranial nerve II-XII: Pupils were equal round reactive to light extraocular movements were full, Left visual field cut on confrontational test. facial sensation and strength were normal. hearing was intact to finger rubbing bilaterally. Uvula tongue midline.  head turning and shoulder shrug and were normal and symmetric.Tongue protrusion into cheek strength was normal.  Motor: he has mild  left arm fixation on rapid rotating movements, mild to moderate  left intrinsic hand muscle atrophy, he has moderate  left finger abduction weakness, mild left hip flexion, ankle dorsiflexion weakness  Sensory: length dependent decreased fine touch, pinprick at feet and ankle, preserved vibratory sensation, and proprioception at toes. He has decreased light touch at the left fourth and fifth fingers  Coordination: Normal finger to nose, heel-to-shin bilaterally there was no truncal ataxia  Gait: He needs to push up from seated position, atalgic cautious, dragging left leg , unsteady  Romberg signs: Negative  Deep tendon reflexes: Brachioradialis 2/2, biceps 2/2, triceps 2/2, patellar 2/2, Achilles 0/0, plantar responses were flexor bilaterally.   DIAGNOSTIC DATA (LABS, IMAGING, TESTING) - I reviewed patient records, labs, notes, testing and imaging myself where available.  Lab Results  Component Value Date   WBC 11.0* 06/16/2011   HGB 12.7* 06/16/2011   HCT 39.1 06/16/2011   MCV 90.5 06/16/2011   PLT 221 06/16/2011      Component Value Date/Time   NA 137 06/16/2011 1034   K 4.3 06/16/2011 1034   CL 103 06/16/2011 1034   CO2 25 06/16/2011 1034   GLUCOSE 131* 06/16/2011 1034   BUN 16 06/16/2011 1034   CREATININE 0.81 06/16/2011 1034   CALCIUM 9.5 06/16/2011 1034   PROT 6.4 06/01/2008 2200   ALBUMIN 3.6 06/01/2008 2200   AST 19  06/01/2008 2200   ALT 16 06/01/2008 2200   ALKPHOS 70 06/01/2008 2200   BILITOT 0.9 06/01/2008 2200   GFRNONAA >90 06/16/2011 1034   GFRAA >90 06/16/2011 1034   Lab Results  Component Value Date   CHOL  Value: 176        ATP III CLASSIFICATION:  <200     mg/dL   Desirable  409-811  mg/dL   Borderline High  >=914    mg/dL   High        7/82/9562   HDL 29* 06/03/2008   LDLCALC  Value: 101        Total Cholesterol/HDL:CHD Risk Coronary Heart Disease Risk Table                     Men   Women  1/2 Average Risk   3.4   3.3  Average Risk       5.0   4.4  2 X Average Risk   9.6   7.1  3 X Average Risk  23.4   11.0        Use the calculated Patient Ratio above and the CHD Risk Table to determine the patient's CHD Risk.        ATP III CLASSIFICATION (LDL):  <100     mg/dL   Optimal  130-865  mg/dL   Near or Above                    Optimal  130-159  mg/dL   Borderline  784-696  mg/dL   High  >295     mg/dL   Very High* 2/84/1324   TRIG 229* 06/03/2008   CHOLHDL 6.1 06/03/2008   Lab Results  Component Value Date   HGBA1C  Value: 11.1 (NOTE)   The ADA recommends the following therapeutic goal for glycemic   control related to Hgb A1C measurement:   Goal of Therapy:   < 7.0% Hgb A1C   Reference: American Diabetes Association: Clinical Practice   Recommendations 2008, Diabetes Care,  2008, 31:(Suppl 1).* 06/03/2008  ASSESSMENT AND PLAN   57 years old right-handed [place] American male, with past medical history of diabetes, coronary artery disease, strokes, low back surgery twice, now presenting with persistent low back pain, gait difficulty, mild left hemiparesis, left visual field deficit,  1. His frequent falling, gait difficulty are multifactories, including stroke, left hemiparesis, low back pain,  2. I will refer him to physical therapy, MRI of lumbar to evaluate his lumbar stenosis  3  Return to clinic as needed.   Levert Feinstein, M.D. Ph.D.  [facility] [address], [address] [place], [place] [address] [phone]

## 2013-03-10 ENCOUNTER — Ambulatory Visit: Payer: Medicare Other

## 2013-03-17 ENCOUNTER — Ambulatory Visit: Payer: Medicare Other | Admitting: Physical Therapy

## 2014-06-26 ENCOUNTER — Other Ambulatory Visit: Payer: Self-pay | Admitting: Neurology

## 2014-06-28 NOTE — Telephone Encounter (Signed)
Patient hasn't been seen in over 1 year.  I called, got no answer.  Left message.

## 2014-09-05 NOTE — Consult Note (Signed)
PATIENT NAME:  Gabriel [name], Gabriel [name] MR#:  [id number] DATE OF BIRTH:  1955-12-14  DATE OF CONSULTATION:  01/10/2012  REFERRING PHYSICIAN:   CONSULTING PHYSICIAN:  Woody Kronberg A. Posey Pronto, MD  PRIMARY CARE PHYSICIAN: Dr. Elijio Miles   CHIEF COMPLAINT: Headache and slurred speech today.   HISTORY OF PRESENT ILLNESS: Mr. 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 The patient had sustained a right occipital infarct. The patient has a history of CVA in the past about a year ago which caused him to some left upper extremity chronic weakness and left lower extremity weakness. He also underwent surgery for his ACL on the left knee which is causing him problems to walk for the past few months. Wife noted the patient to be having some slurred speech along with the patient feeling unsteady gait. She brought the patient to the Emergency Room. In the ER the patient complained of headache. His CT of the head is negative for any acute CVA. Currently he is hemodynamically stable and does not have any neuro deficits other than his chronic left upper extremity weakness and left lower extremity weakness.   PAST MEDICAL HISTORY:  1. History of CVA in the past.  2. History of major depression with psychotic features.  3. Prostate cancer.  4. Hypercholesterolemia.  5. Kidney stones.  6. History of MI.  7. Chronic back pain.  8. Type II diabetes.  9. History of CVA with left upper extremity and lower extremity weakness.   ALLERGIES: Penicillin.   MEDICATIONS:  1. Aspirin 325 mg p.o. daily.  2. Senokot 1 tablet daily.  3. Epitol 100 mg at bedtime.  4. Flexeril 10 mg b.i.d.   5. Levemir 55 units in the morning and 45 units in the evening.  6. Lipitor 10 mg at bedtime.  7. Metformin 1000 mg b.i.d.  8. Naproxen  500 mg twice a day as needed.  9. Risperdal 3 mg at bedtime.  10. Vitamin B12 p.o. daily.  11. Sertraline 100 mg daily.   FAMILY HISTORY: CVA, diabetes, end-stage renal disease, and hypertension.   REVIEW OF SYSTEMS: CONSTITUTIONAL: Positive for fatigue and weakness. EYES: Blurred vision which is chronic secondary to his stroke in the past. No glaucoma or cataracts. ENT: No tinnitus, ear pain, hearing loss. RESPIRATORY: No cough, wheeze, hemoptysis. CARDIOVASCULAR: No chest pain, orthopnea, edema, or hypertension. GI: No nausea, vomiting, diarrhea, abdominal pain, or hematemesis. GU: No dysuria or hematuria. ENDOCRINE: No polyuria, nocturia, or thyroid problems. HEMATOLOGY: No anemia or easy bruising. SKIN: No acne or rash. MUSCULOSKELETAL: Positive for arthritis and back pain. NEUROLOGIC: Positive for CVA with left-sided hemiparesis. PSYCH: Positive for depression. All other systems reviewed and negative.   PHYSICAL EXAMINATION:   GENERAL: The patient is awake, alert, oriented x3 not in acute distress.   VITAL SIGNS: He is afebrile, pulse 78, blood pressure 140/89, sats 98% on room air.   HEENT: Atraumatic, normocephalic. Pupils equal, round, and reactive to light and accommodation. Extraocular movements intact. Oral mucosa is moist.   NECK: Supple. No JVD. No carotid bruit.   RESPIRATORY: Clear to auscultation bilaterally. No rales, rhonchi, respiratory distress, or labored breathing.   CARDIOVASCULAR: Both the heart sounds are normal. Rhythm is regular. Rate is normal. No murmur heard. PMI not lateralized. Chest nontender.   EXTREMITIES: Good pedal pulses. Good femoral  pulses. No lower extremity edema.   ABDOMEN: Soft, benign, nontender. No organomegaly.   NEUROLOGICAL: The patient is right-handed. Speech appears clear. He has a soft and slow speech. No nystagmus. Detailed visual exam unable to perform. The patient has chronic left eye blurred vision secondary to old occipital infarct.    MOTOR SYSTEM: Left upper extremity grade 4+/5. The rest of all the extremities are 5/5 strength. Sensory exam essentially negative. Deep tendon jerks 1+ in both upper and lower extremities. Flexors downgoing.   SKIN: Warm and dry.   PSYCHIATRIC: The patient is awake, alert, oriented x3.   LABORATORY, DIAGNOSTIC, AND RADIOLOGICAL DATA: CT of the head shows old occipital infarct. No other acute abnormalities.   White count 10, hemoglobin and hematocrit 12.9 and 39.1, platelet count 204, MCV 92, glucose 254, BUN 16, creatinine 1.3, sodium 137. Rest of the electrolytes normal. Troponin less than 0.02.   EKG normal sinus rhythm.   ASSESSMENT: 59 year old Mr. Guterrez with:  1. Suspected TIA. The patient presented with slurred speech and had an unsteady gait which improved back to baseline. He does have some mild headache. 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 His CT of the head is negative. EKG shows normal sinus rhythm. No obvious new neuro deficits. Will continue aspirin for now.  2. Headache. Received IV morphine.  3. Type II diabetes, on insulin.  4. History of old right-sided infarct with chronic left upper and lower extremity mild weakness.  5. Hyperlipidemia. On statins.  The patient remains neurologically and hemodynamically stable except for his old chronic deficits on the left side. I discussed with wife observation and will get MRI in the morning. Wife was concerned about hospital bills and requested that patient be discharged to home. She was given advice to bring him back to the ER if he declined. She was also explained it was completely her and the patient's decision to take him back and she did voice understanding. No changes in meds were made. The patient is recommended to take Tylenol p.r.n. for headache. Also recommended to call Dr. Bailey Mech office on Monday for  outpatient follow-up.   Overall ER stay remained stable.   TIME SPENT: 50 minutes.   ____________________________ Hart Rochester Posey Pronto, MD sap:drc D: 01/10/2012 16:12:29 ET T: 01/11/2012 10:21:49 ET JOB#: [id number]  cc: Yalonda Sample A. Posey Pronto, MD, <Dictator> Sheikh A. Elijio Miles, MD Ilda Basset MD ELECTRONICALLY SIGNED 01/11/2012 16:30

## 2015-01-20 ENCOUNTER — Other Ambulatory Visit: Payer: Self-pay | Admitting: Neurology

## 2021-06-19 DEATH — deceased
